# Patient Record
Sex: Female | Born: 1950 | Race: White | Hispanic: No | Smoking: Former smoker
Health system: Southern US, Community
[De-identification: ages and names within clinical notes are randomized; demographics above are authoritative.]

## PROBLEM LIST (undated history)

## (undated) DIAGNOSIS — E785 Hyperlipidemia, unspecified: Secondary | ICD-10-CM

## (undated) DIAGNOSIS — R519 Headache, unspecified: Secondary | ICD-10-CM

## (undated) DIAGNOSIS — I10 Essential (primary) hypertension: Secondary | ICD-10-CM

## (undated) DIAGNOSIS — R51 Headache: Secondary | ICD-10-CM

## (undated) DIAGNOSIS — M1990 Unspecified osteoarthritis, unspecified site: Secondary | ICD-10-CM

## (undated) HISTORY — PX: EYE SURGERY: SHX253

## (undated) HISTORY — PX: CATARACT EXTRACTION: SUR2

## (undated) HISTORY — DX: Hyperlipidemia, unspecified: E78.5

## (undated) HISTORY — PX: APPENDECTOMY: SHX54

---
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
## 1977-06-20 HISTORY — PX: TUBAL LIGATION: SHX77

## 1978-06-20 HISTORY — PX: BREAST BIOPSY: SHX20

## 2013-07-22 ENCOUNTER — Other Ambulatory Visit: Payer: Self-pay | Admitting: Internal Medicine

## 2013-07-22 ENCOUNTER — Ambulatory Visit
Admission: RE | Admit: 2013-07-22 | Discharge: 2013-07-22 | Disposition: A | Discharge status: Home / Self Care | Payer: Commercial Managed Care - HMO | Source: Ambulatory Visit | Attending: Internal Medicine | Admitting: Internal Medicine

## 2013-07-22 DIAGNOSIS — M25579 Pain in unspecified ankle and joints of unspecified foot: Secondary | ICD-10-CM

## 2013-09-30 ENCOUNTER — Other Ambulatory Visit: Payer: Self-pay | Admitting: Internal Medicine

## 2013-09-30 ENCOUNTER — Other Ambulatory Visit (HOSPITAL_COMMUNITY)
Admission: RE | Admit: 2013-09-30 | Discharge: 2013-09-30 | Disposition: A | Discharge status: Home / Self Care | Payer: Medicare HMO | Source: Ambulatory Visit | Attending: Internal Medicine | Admitting: Internal Medicine

## 2013-09-30 DIAGNOSIS — Z1151 Encounter for screening for human papillomavirus (HPV): Secondary | ICD-10-CM | POA: Insufficient documentation

## 2013-09-30 DIAGNOSIS — Z124 Encounter for screening for malignant neoplasm of cervix: Secondary | ICD-10-CM | POA: Insufficient documentation

## 2013-11-18 ENCOUNTER — Other Ambulatory Visit: Payer: Self-pay | Admitting: Gastroenterology

## 2014-01-23 ENCOUNTER — Encounter (HOSPITAL_COMMUNITY): Payer: Self-pay | Admitting: Pharmacy Technician

## 2014-01-27 ENCOUNTER — Encounter (HOSPITAL_COMMUNITY): Payer: Self-pay | Admitting: *Deleted

## 2014-02-10 NOTE — Anesthesia Preprocedure Evaluation (Addendum)
Anesthesia Evaluation  Patient identified by MRN, date of birth, ID band Patient awake    Reviewed: Allergy & Precautions, H&P , NPO status , Patient's Chart, lab work & pertinent test results  Airway Mallampati: II TM Distance: >3 FB Neck ROM: full    Dental no notable dental hx. (+) Teeth Intact, Dental Advisory Given   Pulmonary neg pulmonary ROS, Current Smoker,  breath sounds clear to auscultation  Pulmonary exam normal       Cardiovascular Exercise Tolerance: Good hypertension, Pt. on medications Rhythm:regular Rate:Normal     Neuro/Psych  Headaches, negative neurological ROS  negative psych ROS   GI/Hepatic negative GI ROS, Neg liver ROS, GERD-  Medicated and Controlled,  Endo/Other  negative endocrine ROS  Renal/GU negative Renal ROS  negative genitourinary   Musculoskeletal   Abdominal   Peds  Hematology negative hematology ROS (+)   Anesthesia Other Findings   Reproductive/Obstetrics negative OB ROS                          Anesthesia Physical Anesthesia Plan  ASA: II  Anesthesia Plan: MAC   Post-op Pain Management:    Induction:   Airway Management Planned:   Additional Equipment:   Intra-op Plan:   Post-operative Plan:   Informed Consent: I have reviewed the patients History and Physical, chart, labs and discussed the procedure including the risks, benefits and alternatives for the proposed anesthesia with the patient or authorized representative who has indicated his/her understanding and acceptance.   Dental Advisory Given  Plan Discussed with: CRNA and Surgeon  Anesthesia Plan Comments:         Anesthesia Quick Evaluation

## 2014-02-11 ENCOUNTER — Ambulatory Visit (HOSPITAL_COMMUNITY)
Admission: RE | Admit: 2014-02-11 | Discharge: 2014-02-11 | Disposition: A | Discharge status: Home / Self Care | Payer: Medicare HMO | Source: Ambulatory Visit | Attending: Gastroenterology | Admitting: Gastroenterology

## 2014-02-11 ENCOUNTER — Encounter (HOSPITAL_COMMUNITY): Payer: Self-pay | Admitting: *Deleted

## 2014-02-11 ENCOUNTER — Encounter (HOSPITAL_COMMUNITY): Payer: Medicare HMO | Admitting: Anesthesiology

## 2014-02-11 ENCOUNTER — Ambulatory Visit (HOSPITAL_COMMUNITY): Payer: Medicare HMO | Admitting: Anesthesiology

## 2014-02-11 ENCOUNTER — Encounter (HOSPITAL_COMMUNITY)
Admission: RE | Disposition: A | Discharge status: Home / Self Care | Payer: Self-pay | Source: Ambulatory Visit | Attending: Gastroenterology

## 2014-02-11 DIAGNOSIS — Z880 Allergy status to penicillin: Secondary | ICD-10-CM | POA: Insufficient documentation

## 2014-02-11 DIAGNOSIS — I10 Essential (primary) hypertension: Secondary | ICD-10-CM | POA: Insufficient documentation

## 2014-02-11 DIAGNOSIS — Z1211 Encounter for screening for malignant neoplasm of colon: Secondary | ICD-10-CM | POA: Diagnosis present

## 2014-02-11 DIAGNOSIS — K5730 Diverticulosis of large intestine without perforation or abscess without bleeding: Secondary | ICD-10-CM | POA: Insufficient documentation

## 2014-02-11 DIAGNOSIS — F17200 Nicotine dependence, unspecified, uncomplicated: Secondary | ICD-10-CM | POA: Insufficient documentation

## 2014-02-11 HISTORY — DX: Unspecified osteoarthritis, unspecified site: M19.90

## 2014-02-11 HISTORY — DX: Headache: R51

## 2014-02-11 HISTORY — DX: Headache, unspecified: R51.9

## 2014-02-11 HISTORY — DX: Essential (primary) hypertension: I10

## 2014-02-11 HISTORY — PX: COLONOSCOPY WITH PROPOFOL: SHX5780

## 2014-02-11 SURGERY — COLONOSCOPY WITH PROPOFOL
Anesthesia: Monitor Anesthesia Care

## 2014-02-11 MED ORDER — PROPOFOL 10 MG/ML IV BOLUS
INTRAVENOUS | Status: AC
  Filled 2014-02-11: qty 20

## 2014-02-11 MED ORDER — KETAMINE HCL 10 MG/ML IJ SOLN
INTRAMUSCULAR | Status: AC
  Filled 2014-02-11: qty 1

## 2014-02-11 MED ORDER — FENTANYL CITRATE 0.05 MG/ML IJ SOLN
25.0000 ug | INTRAMUSCULAR | Status: DC | PRN
Start: 2014-02-11 — End: 2014-02-11

## 2014-02-11 MED ORDER — LIDOCAINE HCL (CARDIAC) 20 MG/ML IV SOLN
INTRAVENOUS | Status: AC
  Filled 2014-02-11: qty 5

## 2014-02-11 MED ORDER — ONDANSETRON HCL 4 MG/2ML IJ SOLN
INTRAMUSCULAR | Status: AC
  Filled 2014-02-11: qty 2

## 2014-02-11 MED ORDER — SODIUM CHLORIDE 0.9 % IV SOLN: INTRAVENOUS | Status: DC

## 2014-02-11 MED ORDER — KETAMINE HCL 10 MG/ML IJ SOLN
INTRAMUSCULAR | Status: DC | PRN
  Administered 2014-02-11: 20 mg via INTRAVENOUS

## 2014-02-11 MED ORDER — MIDAZOLAM HCL 2 MG/2ML IJ SOLN
INTRAMUSCULAR | Status: DC | PRN
  Administered 2014-02-11 (×2): 1 mg via INTRAVENOUS

## 2014-02-11 MED ORDER — MIDAZOLAM HCL 2 MG/2ML IJ SOLN
INTRAMUSCULAR | Status: AC
  Filled 2014-02-11: qty 2

## 2014-02-11 MED ORDER — PROPOFOL INFUSION 10 MG/ML OPTIME
INTRAVENOUS | Status: DC | PRN
Start: 2014-02-11 — End: 2014-02-11
  Administered 2014-02-11: 120 ug/kg/min via INTRAVENOUS

## 2014-02-11 MED ORDER — LIDOCAINE HCL (CARDIAC) 20 MG/ML IV SOLN
INTRAVENOUS | Status: DC | PRN
  Administered 2014-02-11: 100 mg via INTRAVENOUS

## 2014-02-11 MED ORDER — LACTATED RINGERS IV SOLN
INTRAVENOUS | Status: DC
  Administered 2014-02-11: 07:00:00 via INTRAVENOUS

## 2014-02-11 MED ORDER — ONDANSETRON HCL 4 MG/2ML IJ SOLN
INTRAMUSCULAR | Status: DC | PRN
  Administered 2014-02-11: 4 mg via INTRAVENOUS

## 2014-02-11 SURGICAL SUPPLY — 22 items

## 2014-02-11 NOTE — Op Note (Signed)
Procedure: Baseline screening colonoscopy  Endoscopist: Danise Edge  Premedication: Propofol administered by anesthesia  Procedure: The patient was placed in the left lateral decubitus position. Anal inspection and digital rectal exam were normal. The Pentax pediatric colonoscope was introduced into the rectum and advanced to the cecum. A normal-appearing appendiceal orifice was identified. A normal-appearing ileocecal valve was identified. Colonic preparation for the exam today was good. The procedure was technically difficult to perform due to colonic loop formation.  Rectum. Normal. Retroflexed view of the distal rectum normal  Sigmoid colon and descending colon. Left colonic diverticulosis  Splenic flexure. Normal  Transverse colon. Normal  Hepatic flexure. Normal  Ascending colon. Normal  Cecum and ileocecal valve. Normal  Assessment: Normal screening colonoscopy  Recommendation: Schedule repeat screening colonoscopy in 10 years

## 2014-02-11 NOTE — Anesthesia Postprocedure Evaluation (Signed)
  Anesthesia Post-op Note  Patient: Jaclyn Golden  Procedure(s) Performed: Procedure(s) (LRB): COLONOSCOPY WITH PROPOFOL (N/A)  Patient Location: PACU  Anesthesia Type: MAC  Level of Consciousness: awake and alert   Airway and Oxygen Therapy: Patient Spontanous Breathing  Post-op Pain: mild  Post-op Assessment: Post-op Vital signs reviewed, Patient's Cardiovascular Status Stable, Respiratory Function Stable, Patent Airway and No signs of Nausea or vomiting  Last Vitals:  Filed Vitals:   02/11/14 0845  BP: 122/101  Pulse: 58  Temp:   Resp: 23    Post-op Vital Signs: stable   Complications: No apparent anesthesia complications

## 2014-02-11 NOTE — H&P (Signed)
  Procedure: Baseline screening colonoscopy  History: The patient is a 63 year old female born December 08, 1950. She is scheduled to undergo her first screening colonoscopy with polypectomy to prevent colon cancer.  Medication allergies: Penicillin  Past medical history: Appendectomy. Benign left breast lumpectomy. Osteopenia.  Family history: Negative for colon cancer  Habits: The patient smokes one half pack of cigarettes daily  Exam: The patient is alert and lying comfortably on the endoscopy stretcher. Abdomen is soft and nontender to palpation. Lungs are clear to auscultation. Cardiac exam reveals a regular rhythm.  Plan: Proceed with baseline screening colonoscopy

## 2014-02-11 NOTE — Discharge Instructions (Signed)
Colonoscopy, Care After °These instructions give you information on caring for yourself after your procedure. Your doctor may also give you more specific instructions. Call your doctor if you have any problems or questions after your procedure. °HOME CARE °· Do not drive for 24 hours. °· Do not sign important papers or use machinery for 24 hours. °· You may shower. °· You may go back to your usual activities, but go slower for the first 24 hours. °· Take rest breaks often during the first 24 hours. °· Walk around or use warm packs on your belly (abdomen) if you have belly cramping or gas. °· Drink enough fluids to keep your pee (urine) clear or pale yellow. °· Resume your normal diet. Avoid heavy or fried foods. °· Avoid drinking alcohol for 24 hours or as told by your doctor. °· Only take medicines as told by your doctor. °If a tissue sample (biopsy) was taken during the procedure:  °· Do not take aspirin or blood thinners for 7 days, or as told by your doctor. °· Do not drink alcohol for 7 days, or as told by your doctor. °· Eat soft foods for the first 24 hours. °GET HELP IF: °You still have a small amount of blood in your poop (stool) 2-3 days after the procedure. °GET HELP RIGHT AWAY IF: °· You have more than a small amount of blood in your poop. °· You see clumps of tissue (blood clots) in your poop. °· Your belly is puffy (swollen). °· You feel sick to your stomach (nauseous) or throw up (vomit). °· You have a fever. °· You have belly pain that gets worse and medicine does not help. °MAKE SURE YOU: °· Understand these instructions. °· Will watch your condition. °· Will get help right away if you are not doing well or get worse. °Document Released: 07/09/2010 Document Revised: 06/11/2013 Document Reviewed: 02/11/2013 °[employer]® Patient Information ©2015 [employer]. This information is not intended to replace advice given to you by your health care provider. Make sure you discuss any questions you have with  your health care provider. ° °

## 2014-02-11 NOTE — Transfer of Care (Signed)
Immediate Anesthesia Transfer of Care Note  Patient: Jaclyn Golden  Procedure(s) Performed: Procedure(s) (LRB): COLONOSCOPY WITH PROPOFOL (N/A)  Patient Location: PACU  Anesthesia Type: MAC  Level of Consciousness: sedated, patient cooperative and responds to stimulation  Airway & Oxygen Therapy: Patient Spontanous Breathing and Patient connected to face mask oxgen  Post-op Assessment: Report given to PACU RN and Post -op Vital signs reviewed and stable  Post vital signs: Reviewed and stable  Complications: No apparent anesthesia complications

## 2014-02-12 ENCOUNTER — Encounter (HOSPITAL_COMMUNITY): Payer: Self-pay | Admitting: Gastroenterology

## 2014-02-28 ENCOUNTER — Encounter: Payer: Self-pay | Admitting: Neurology

## 2014-02-28 ENCOUNTER — Ambulatory Visit (INDEPENDENT_AMBULATORY_CARE_PROVIDER_SITE_OTHER): Payer: Commercial Managed Care - HMO | Admitting: Neurology

## 2014-02-28 VITALS — BP 140/88 | HR 102 | Temp 98.0°F | Resp 18 | Ht 62.0 in | Wt 157.4 lb

## 2014-02-28 DIAGNOSIS — Z8249 Family history of ischemic heart disease and other diseases of the circulatory system: Secondary | ICD-10-CM

## 2014-02-28 DIAGNOSIS — N643 Galactorrhea not associated with childbirth: Secondary | ICD-10-CM

## 2014-02-28 DIAGNOSIS — Z823 Family history of stroke: Secondary | ICD-10-CM

## 2014-02-28 DIAGNOSIS — R51 Headache: Secondary | ICD-10-CM

## 2014-02-28 NOTE — Progress Notes (Signed)
NEUROLOGY CONSULTATION NOTE  MIRIYA CLOER MRN: [id number] DOB: 1950-11-20  Referring provider: Dr. Lonzo Cloud Primary care provider: Dr. Lonzo Cloud  Reason for consult:  Headache  HISTORY OF PRESENT ILLNESS: Jaclyn Golden is a 63 year old right-handed woman with history of hypertension who presents for headache.  Records reviewed.  She recently established care with a PCP.  She wasn't seen by a physician for several years.  She reported that she began lactating for the past year.  TSH and prolactin levels were unremarkable.  Mammogram was okay.  Beginning about two years ago, she began experiencing a headache.  She describes it as a small circumscribed area in the right occipital region, like a mild shooting type pain, lasting only a few seconds to minutes.  It would occur about 4 or 5 times a week.  When she lays down in bed, she feels a mild bruised feeling just above that area.  Many years ago, she had headaches, which seemed like migraines.  She would have a visual aura of seeing lightening bolts, followed by headache and sometimes nausea.  She hasn't had this for many years.  Recently, she began having the visual symptoms again.  Regarding family history, she had two brothers who died of a cerebral aneurysm and her mom had stroke.  09/30/13 LABS:  WBC 6.5, HGB 14.8, HCT 43.9, PLT 164, Na 139, K 4.5, Cl 103, glucose 87, BUN 14, Cr 0.75 01/10/14 LABS:  TSH 2.19, prolactin 6.15  PAST MEDICAL HISTORY: Past Medical History  Diagnosis Date  . Hypertension   . Pain in head     occasional pain back of head on right for last couple of years, to see neurlogist for   . Arthritis     right ankle, wears brace prn    PAST SURGICAL HISTORY: Past Surgical History  Procedure Laterality Date  . Breast biopsy Left 1980  . Tubal ligation  1979  . Appendectomy  1980's    ruptured  . Colonoscopy with propofol N/A 02/11/2014    Procedure: COLONOSCOPY WITH PROPOFOL;  Surgeon: Charolett Bumpers,  MD;  Location: [facility];  Service: Endoscopy;  Laterality: N/A;    MEDICATIONS: Current Outpatient Prescriptions on File Prior to Visit  Medication Sig Dispense Refill  . aspirin EC 81 MG tablet Take 81 mg by mouth daily.      . cholecalciferol (VITAMIN D) 1000 UNITS tablet Take 1,000 Units by mouth daily.      . diclofenac (VOLTAREN) 75 MG EC tablet Take 75 mg by mouth 2 (two) times daily as needed (Pain).      . Garlic (GARLIQUE PO) Take 1 tablet by mouth daily.      . hydrochlorothiazide (MICROZIDE) 12.5 MG capsule Take 12.5 mg by mouth every morning.      . Omega-3 Fatty Acids (FISH OIL) 1000 MG CAPS Take 1,000 mg by mouth daily.      . folic acid (FOLVITE) 800 MCG tablet Take 800 mcg by mouth daily.      Marland Kitchen omeprazole (PRILOSEC) 20 MG capsule Take 20 mg by mouth daily as needed (Acid Reflux).       No current facility-administered medications on file prior to visit.    ALLERGIES: Allergies  Allergen Reactions  . Penicillins     Pass out     FAMILY HISTORY: Family History  Problem Relation Age of Onset  . Stroke Mother   . Anuerysm Brother   . Heart failure Brother   . Cancer  Father     SOCIAL HISTORY: History   Social History  . Marital Status: Married    Spouse Name: N/A    Number of Children: N/A  . Years of Education: N/A   Occupational History  . Not on file.   Social History Main Topics  . Smoking status: Current Every Day Smoker -- 0.50 packs/day for 47 years    Types: Cigarettes  . Smokeless tobacco: Never Used     Comment: patient is aware she needs to quit   . Alcohol Use: No  . Drug Use: No  . Sexual Activity: No   Other Topics Concern  . Not on file   Social History Narrative  . No narrative on file    REVIEW OF SYSTEMS: Constitutional: No fevers, chills, or sweats, no generalized fatigue, change in appetite Eyes: No visual changes, double vision, eye pain Ear, nose and throat: No hearing loss, ear pain, nasal congestion, sore  throat Cardiovascular: No chest pain, palpitations Respiratory:  No shortness of breath at rest or with exertion, wheezes GastrointestinaI: No nausea, vomiting, diarrhea, abdominal pain, fecal incontinence Genitourinary:  No dysuria, urinary retention or frequency Musculoskeletal:  No neck pain, back pain Integumentary: No rash, pruritus, skin lesions Neurological: as above Psychiatric: No depression, insomnia, anxiety Endocrine: Galactorrhea.  No palpitations, fatigue, diaphoresis, mood swings, change in appetite, change in weight, increased thirst Hematologic/Lymphatic:  No anemia, purpura, petechiae. Allergic/Immunologic: no itchy/runny eyes, nasal congestion, recent allergic reactions, rashes  PHYSICAL EXAM: Filed Vitals:   02/28/14 0931  BP: 140/88  Pulse: 102  Temp: 98 F (36.7 C)  Resp: 18   General: No acute distress Head:  Normocephalic/atraumatic Neck: supple, no paraspinal tenderness, full range of motion Back: No paraspinal tenderness Heart: regular rate and rhythm Lungs: Clear to auscultation bilaterally. Vascular: No carotid bruits. Neurological Exam: Mental status: alert and oriented to person, place, and time, recent and remote memory intact, fund of knowledge intact, attention and concentration intact, speech fluent and not dysarthric, language intact. Cranial nerves: CN I: not tested CN II: pupils equal, round and reactive to light, visual fields intact, fundi unremarkable, without vessel changes, exudates, hemorrhages or papilledema. CN III, IV, VI:  full range of motion, no nystagmus, no ptosis CN V: facial sensation intact CN VII: upper and lower face symmetric CN VIII: hearing intact CN IX, X: gag intact, uvula midline CN XI: sternocleidomastoid and trapezius muscles intact CN XII: tongue midline Bulk & Tone: normal, no fasciculations. Motor: 5/5 throughout Sensation: pinprick and vibration intact Deep Tendon Reflexes: 2+ throughout, toes withdraw  bilaterally Finger to nose testing: no dysmetria Heel to shin: no dysmetria Gait: normal station and stride.  Able to turn and walk in tandem. Romberg negative.  IMPRESSION: Headache, possibly nummular headache, which is diagnosis of exclusion Visual aura Galactorrhea Family history of cerebral aneurysm in 2 first degree relatives.  PLAN: 1.  Will get MRI of the brain with and without contrast and MRA of the head to evaluate for intracranial abnormality such as pituitary adenoma or aneurysm 2.  If MRI unremarkable, would suggest considering referral to endocrinology 3.  Regarding headache, suggested over the counter analgesics, although they are so brief in duration.  Otherwise, we can try gabapentin or nortriptyline.   4.  We will contact her with MRI results and advise further management pending these results.  If unremarkable, she may follow up as needed for headache treatment.  45 minutes spent with patient, over 50% spent discussing possible etiologies and  coordinating plan.  Thank you for allowing me to take part in the care of this patient.  Shon Millet, DO  CC:  Orland Penman, MD

## 2014-02-28 NOTE — Patient Instructions (Addendum)
1.  We will get MRI of brain with and without contrast and MRA of the head without contrast.  We will let you know what the results show and whether further workup is necessary.  If you would like medication for the headache, call.  You can try over the counter analgesics. [facility]  03/13/14 7:45 am

## 2014-03-13 ENCOUNTER — Telehealth: Payer: Self-pay | Admitting: *Deleted

## 2014-03-13 ENCOUNTER — Ambulatory Visit (HOSPITAL_COMMUNITY): Payer: Medicare HMO

## 2014-03-13 ENCOUNTER — Other Ambulatory Visit (HOSPITAL_COMMUNITY): Payer: Medicare HMO

## 2014-03-13 ENCOUNTER — Ambulatory Visit (HOSPITAL_COMMUNITY)
Admission: RE | Admit: 2014-03-13 | Discharge: 2014-03-13 | Disposition: A | Discharge status: Home / Self Care | Payer: Medicare HMO | Source: Ambulatory Visit | Attending: Neurology | Admitting: Neurology

## 2014-03-13 DIAGNOSIS — Z8249 Family history of ischemic heart disease and other diseases of the circulatory system: Secondary | ICD-10-CM

## 2014-03-13 DIAGNOSIS — N643 Galactorrhea not associated with childbirth: Secondary | ICD-10-CM | POA: Diagnosis not present

## 2014-03-13 DIAGNOSIS — R51 Headache: Secondary | ICD-10-CM | POA: Insufficient documentation

## 2014-03-13 DIAGNOSIS — Z823 Family history of stroke: Secondary | ICD-10-CM | POA: Insufficient documentation

## 2014-03-13 LAB — POCT I-STAT CREATININE: CREATININE: 0.9 mg/dL (ref 0.50–1.10)

## 2014-03-13 MED ORDER — GADOBENATE DIMEGLUMINE 529 MG/ML IV SOLN
11.0000 mL | Freq: Once | INTRAVENOUS | Status: AC | PRN
  Administered 2014-03-13: 11 mL via INTRAVENOUS

## 2014-03-13 NOTE — Telephone Encounter (Signed)
Please give patient a call  Call back number [phone]

## 2014-03-13 NOTE — Telephone Encounter (Signed)
patient is aware of MRI results . 

## 2014-03-13 NOTE — Telephone Encounter (Signed)
Message copied by Fredirick Maudlin on Thu Mar 13, 2014  1:08 PM ------      Message from: JAFFE, ADAM R      Created: Thu Mar 13, 2014  9:02 AM       MRI shows no evidence for stroke, tumor, aneurysm or abnormality of the pituitary gland.  It does show an incidental cyst, which is completely benign and not clinically significant.  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  She is already on aspirin, which is appropriate.  I would have her PCP check her cholesterol, if it has not already been done.  At this point, I would just monitor.  Follow up as needed.      ----- Message -----         From: Rad Results In Interface         Sent: 03/13/2014   8:47 AM           To: Cira Servant, DO                   ------

## 2014-04-15 ENCOUNTER — Encounter (HOSPITAL_COMMUNITY): Payer: Self-pay | Admitting: Emergency Medicine

## 2014-04-15 ENCOUNTER — Emergency Department (HOSPITAL_COMMUNITY)
Admission: EM | Admit: 2014-04-15 | Discharge: 2014-04-15 | Disposition: A | Discharge status: Home / Self Care | Payer: Commercial Managed Care - HMO | Attending: Emergency Medicine | Admitting: Emergency Medicine

## 2014-04-15 DIAGNOSIS — I10 Essential (primary) hypertension: Secondary | ICD-10-CM | POA: Diagnosis not present

## 2014-04-15 DIAGNOSIS — W231XXA Caught, crushed, jammed, or pinched between stationary objects, initial encounter: Secondary | ICD-10-CM | POA: Insufficient documentation

## 2014-04-15 DIAGNOSIS — S61412A Laceration without foreign body of left hand, initial encounter: Secondary | ICD-10-CM

## 2014-04-15 DIAGNOSIS — Z791 Long term (current) use of non-steroidal anti-inflammatories (NSAID): Secondary | ICD-10-CM | POA: Diagnosis not present

## 2014-04-15 DIAGNOSIS — Z7982 Long term (current) use of aspirin: Secondary | ICD-10-CM | POA: Diagnosis not present

## 2014-04-15 DIAGNOSIS — Z79899 Other long term (current) drug therapy: Secondary | ICD-10-CM | POA: Diagnosis not present

## 2014-04-15 DIAGNOSIS — Y9289 Other specified places as the place of occurrence of the external cause: Secondary | ICD-10-CM | POA: Insufficient documentation

## 2014-04-15 DIAGNOSIS — Z880 Allergy status to penicillin: Secondary | ICD-10-CM | POA: Diagnosis not present

## 2014-04-15 DIAGNOSIS — Y93K1 Activity, walking an animal: Secondary | ICD-10-CM | POA: Insufficient documentation

## 2014-04-15 DIAGNOSIS — M1990 Unspecified osteoarthritis, unspecified site: Secondary | ICD-10-CM | POA: Diagnosis not present

## 2014-04-15 DIAGNOSIS — Z720 Tobacco use: Secondary | ICD-10-CM | POA: Diagnosis not present

## 2014-04-15 MED ORDER — DOXYCYCLINE HYCLATE 100 MG PO CAPS: 100.0000 mg | ORAL_CAPSULE | Freq: Two times a day (BID) | ORAL | Status: DC

## 2014-04-15 MED ORDER — DOXYCYCLINE HYCLATE 100 MG PO TABS
100.0000 mg | ORAL_TABLET | Freq: Once | ORAL | Status: AC
  Administered 2014-04-15: 100 mg via ORAL
  Filled 2014-04-15: qty 1

## 2014-04-15 NOTE — Discharge Instructions (Signed)
Part of your wound was closed with steri strip and part sutured through the steri strips the sutures should be removed in 10 days  Leave the dressing on for 48 hours than remove and apply a smaller bandage  Do no apply topical antibiotic ointment as it will loosen the steri strips

## 2014-04-15 NOTE — ED Provider Notes (Signed)
Medical screening examination/treatment/procedure(s) were performed by non-physician practitioner and as supervising physician I was immediately available for consultation/collaboration.   EKG Interpretation None       Vannie Hilgert, MD 04/15/14 2240 

## 2014-04-15 NOTE — ED Provider Notes (Addendum)
CSN: [id number]     Arrival date & time 04/15/14  1848 History   First MD Initiated Contact with Patient 04/15/14 2043     Chief Complaint  Patient presents with  . Extremity Laceration     (Consider location/radiation/quality/duration/timing/severity/associated sxs/prior Treatment) HPI Comments: Laceration to left hand after door closed on it has been applying pressure but continues to bleed   The history is provided by the patient.    Past Medical History  Diagnosis Date  . Hypertension   . Pain in head     occasional pain back of head on right for last couple of years, to see neurlogist for   . Arthritis     right ankle, wears brace prn   Past Surgical History  Procedure Laterality Date  . Breast biopsy Left 1980  . Tubal ligation  1979  . Appendectomy  1980's    ruptured  . Colonoscopy with propofol N/A 02/11/2014    Procedure: COLONOSCOPY WITH PROPOFOL;  Surgeon: Charolett BumpersMartin K Johnson, MD;  Location: [facility];  Service: Endoscopy;  Laterality: N/A;   Family History  Problem Relation Age of Onset  . Stroke Mother   . Anuerysm Brother   . Heart failure Brother   . Cancer Father    History  Substance Use Topics  . Smoking status: Current Every Day Smoker -- 0.50 packs/day for 47 years    Types: Cigarettes  . Smokeless tobacco: Never Used     Comment: patient is aware she needs to quit   . Alcohol Use: No   OB History   Grav Para Term Preterm Abortions TAB SAB Ect Mult Living                 Review of Systems  Musculoskeletal: Negative for joint swelling.  Skin: Positive for wound.  Neurological: Negative for weakness and numbness.  All other systems reviewed and are negative.     Allergies  Penicillins  Home Medications   Prior to Admission medications   Medication Sig Start Date End Date Taking? Authorizing Provider  aspirin EC 81 MG tablet Take 81 mg by mouth daily.    Historical Provider, MD  cholecalciferol (VITAMIN D) 1000 UNITS tablet Take  1,000 Units by mouth daily.    Historical Provider, MD  diclofenac (VOLTAREN) 75 MG EC tablet Take 75 mg by mouth 2 (two) times daily as needed (Pain).    Historical Provider, MD  doxycycline (VIBRAMYCIN) 100 MG capsule Take 1 capsule (100 mg total) by mouth 2 (two) times daily. 04/15/14   Arman FilterGail K Bettie Capistran, NP  folic acid (FOLVITE) 800 MCG tablet Take 800 mcg by mouth daily.    Historical Provider, MD  Garlic (GARLIQUE PO) Take 1 tablet by mouth daily.    Historical Provider, MD  hydrochlorothiazide (MICROZIDE) 12.5 MG capsule Take 12.5 mg by mouth every morning.    Historical Provider, MD  Omega-3 Fatty Acids (FISH OIL) 1000 MG CAPS Take 1,000 mg by mouth daily.    Historical Provider, MD  omeprazole (PRILOSEC) 20 MG capsule Take 20 mg by mouth daily as needed (Acid Reflux).    Historical Provider, MD   BP 151/108  Pulse 73  Temp(Src) 97.7 F (36.5 C) (Oral)  Resp 18  SpO2 95% Physical Exam  Constitutional: She appears well-developed and well-nourished.  Eyes: Pupils are equal, round, and reactive to light.  Neck: Normal range of motion.  Cardiovascular: Normal rate.   Musculoskeletal: Normal range of motion. She exhibits tenderness. She exhibits  no edema.       Hands: Skin: Skin is warm.  Nursing note and vitals reviewed.   ED Course  LACERATION REPAIR Date/Time: 04/15/2014 9:39 PM Performed by: Arman FilterSCHULZ, Stevie Charter K Authorized by: Arman FilterSCHULZ, Makenli Derstine K Consent: Verbal consent obtained. Risks and benefits: risks, benefits and alternatives were discussed Consent given by: patient Patient understanding: patient states understanding of the procedure being performed Patient identity confirmed: verbally with patient Body area: upper extremity Location details: left hand Laceration length: 6 cm Foreign bodies: no foreign bodies Tendon involvement: none Nerve involvement: none Vascular damage: no Anesthesia: local infiltration Local anesthetic: lidocaine 2% without epinephrine Anesthetic  total: 1 ml Patient sedated: no Irrigation solution: saline Skin closure: 4-0 Prolene and Steri-Strips Number of sutures: 2 Technique: simple Approximation: loose Approximation difficulty: simple Dressing: pressure dressing Patient tolerance: Patient tolerated the procedure well with no immediate complications   (including critical care time) Labs Review Labs Reviewed - No data to display  Imaging Review No results found.   EKG Interpretation None     Part of the laceration closed with steri strips and part sutured  Sutures to be removed in 10 day  MDM   Final diagnoses:  Laceration of hand, left, initial encounter         Arman FilterGail K Auden Tatar, NP 04/15/14 2141  Arman FilterGail K Shabazz Mckey, NP 04/25/14 [id number]  Arby BarretteMarcy Pfeiffer, MD 04/28/14 1705

## 2014-04-15 NOTE — ED Notes (Signed)
Pt walking dog today.  Caught hand on storm door.  Laceration to left hand.  Occurred at 2pm and wound continues to bleed.  Pressure dressing applied.

## 2014-12-15 ENCOUNTER — Other Ambulatory Visit: Payer: Self-pay

## 2015-03-04 DIAGNOSIS — H5319 Other subjective visual disturbances: Secondary | ICD-10-CM | POA: Diagnosis not present

## 2015-03-04 DIAGNOSIS — H2512 Age-related nuclear cataract, left eye: Secondary | ICD-10-CM | POA: Diagnosis not present

## 2015-03-04 DIAGNOSIS — H35362 Drusen (degenerative) of macula, left eye: Secondary | ICD-10-CM | POA: Diagnosis not present

## 2015-03-04 DIAGNOSIS — H25012 Cortical age-related cataract, left eye: Secondary | ICD-10-CM | POA: Diagnosis not present

## 2015-03-18 DIAGNOSIS — H43811 Vitreous degeneration, right eye: Secondary | ICD-10-CM | POA: Diagnosis not present

## 2015-03-23 DIAGNOSIS — I10 Essential (primary) hypertension: Secondary | ICD-10-CM | POA: Diagnosis not present

## 2015-03-23 DIAGNOSIS — M79604 Pain in right leg: Secondary | ICD-10-CM | POA: Diagnosis not present

## 2015-03-23 DIAGNOSIS — E785 Hyperlipidemia, unspecified: Secondary | ICD-10-CM | POA: Diagnosis not present

## 2015-03-23 DIAGNOSIS — Z23 Encounter for immunization: Secondary | ICD-10-CM | POA: Diagnosis not present

## 2015-03-23 DIAGNOSIS — F17210 Nicotine dependence, cigarettes, uncomplicated: Secondary | ICD-10-CM | POA: Diagnosis not present

## 2015-03-23 DIAGNOSIS — Z1389 Encounter for screening for other disorder: Secondary | ICD-10-CM | POA: Diagnosis not present

## 2015-03-23 DIAGNOSIS — Z0001 Encounter for general adult medical examination with abnormal findings: Secondary | ICD-10-CM | POA: Diagnosis not present

## 2015-03-25 DIAGNOSIS — M47816 Spondylosis without myelopathy or radiculopathy, lumbar region: Secondary | ICD-10-CM | POA: Diagnosis not present

## 2015-03-25 DIAGNOSIS — M5136 Other intervertebral disc degeneration, lumbar region: Secondary | ICD-10-CM | POA: Diagnosis not present

## 2015-03-25 DIAGNOSIS — M25561 Pain in right knee: Secondary | ICD-10-CM | POA: Diagnosis not present

## 2015-03-25 DIAGNOSIS — M25551 Pain in right hip: Secondary | ICD-10-CM | POA: Diagnosis not present

## 2015-04-03 DIAGNOSIS — Z1231 Encounter for screening mammogram for malignant neoplasm of breast: Secondary | ICD-10-CM | POA: Diagnosis not present

## 2015-04-08 DIAGNOSIS — M5136 Other intervertebral disc degeneration, lumbar region: Secondary | ICD-10-CM | POA: Diagnosis not present

## 2015-04-08 DIAGNOSIS — M47816 Spondylosis without myelopathy or radiculopathy, lumbar region: Secondary | ICD-10-CM | POA: Diagnosis not present

## 2015-04-13 DIAGNOSIS — H5319 Other subjective visual disturbances: Secondary | ICD-10-CM | POA: Diagnosis not present

## 2015-04-13 DIAGNOSIS — H35341 Macular cyst, hole, or pseudohole, right eye: Secondary | ICD-10-CM | POA: Diagnosis not present

## 2015-04-21 ENCOUNTER — Encounter (INDEPENDENT_AMBULATORY_CARE_PROVIDER_SITE_OTHER): Payer: Self-pay | Admitting: Ophthalmology

## 2015-04-22 ENCOUNTER — Encounter (INDEPENDENT_AMBULATORY_CARE_PROVIDER_SITE_OTHER): Payer: Self-pay | Admitting: Ophthalmology

## 2015-04-27 ENCOUNTER — Encounter (INDEPENDENT_AMBULATORY_CARE_PROVIDER_SITE_OTHER): Payer: Self-pay | Admitting: Ophthalmology

## 2015-04-30 ENCOUNTER — Encounter (INDEPENDENT_AMBULATORY_CARE_PROVIDER_SITE_OTHER): Payer: Self-pay | Admitting: Ophthalmology

## 2015-05-06 ENCOUNTER — Encounter (INDEPENDENT_AMBULATORY_CARE_PROVIDER_SITE_OTHER): Payer: Commercial Managed Care - HMO | Admitting: Ophthalmology

## 2015-05-06 DIAGNOSIS — H2513 Age-related nuclear cataract, bilateral: Secondary | ICD-10-CM | POA: Diagnosis not present

## 2015-05-06 DIAGNOSIS — H35033 Hypertensive retinopathy, bilateral: Secondary | ICD-10-CM | POA: Diagnosis not present

## 2015-05-06 DIAGNOSIS — H43813 Vitreous degeneration, bilateral: Secondary | ICD-10-CM | POA: Diagnosis not present

## 2015-05-06 DIAGNOSIS — I10 Essential (primary) hypertension: Secondary | ICD-10-CM | POA: Diagnosis not present

## 2015-05-06 DIAGNOSIS — H35341 Macular cyst, hole, or pseudohole, right eye: Secondary | ICD-10-CM

## 2015-07-01 DIAGNOSIS — H2512 Age-related nuclear cataract, left eye: Secondary | ICD-10-CM | POA: Diagnosis not present

## 2015-07-01 DIAGNOSIS — H2511 Age-related nuclear cataract, right eye: Secondary | ICD-10-CM | POA: Diagnosis not present

## 2015-07-01 DIAGNOSIS — H35341 Macular cyst, hole, or pseudohole, right eye: Secondary | ICD-10-CM | POA: Diagnosis not present

## 2015-07-07 ENCOUNTER — Ambulatory Visit: Admit: 2015-07-07 | Payer: Medicare HMO | Admitting: Ophthalmology

## 2015-07-07 SURGERY — 25 GAUGE PARS PLANA VITRECTOMY WITH 20 GAUGE MVR PORT FOR MACULAR HOLE
Anesthesia: General | Laterality: Right

## 2015-07-14 ENCOUNTER — Ambulatory Visit (INDEPENDENT_AMBULATORY_CARE_PROVIDER_SITE_OTHER): Payer: Commercial Managed Care - HMO | Admitting: Ophthalmology

## 2015-07-21 DIAGNOSIS — H547 Unspecified visual loss: Secondary | ICD-10-CM | POA: Diagnosis not present

## 2015-07-21 DIAGNOSIS — H35341 Macular cyst, hole, or pseudohole, right eye: Secondary | ICD-10-CM | POA: Diagnosis not present

## 2015-07-28 ENCOUNTER — Encounter (HOSPITAL_COMMUNITY): Payer: Self-pay

## 2015-07-28 ENCOUNTER — Emergency Department (HOSPITAL_COMMUNITY): Payer: Commercial Managed Care - HMO

## 2015-07-28 ENCOUNTER — Emergency Department (HOSPITAL_COMMUNITY)
Admission: EM | Admit: 2015-07-28 | Discharge: 2015-07-28 | Disposition: A | Discharge status: Home / Self Care | Payer: Commercial Managed Care - HMO | Attending: Emergency Medicine | Admitting: Emergency Medicine

## 2015-07-28 DIAGNOSIS — Y92239 Unspecified place in hospital as the place of occurrence of the external cause: Secondary | ICD-10-CM | POA: Insufficient documentation

## 2015-07-28 DIAGNOSIS — I10 Essential (primary) hypertension: Secondary | ICD-10-CM | POA: Diagnosis not present

## 2015-07-28 DIAGNOSIS — W08XXXA Fall from other furniture, initial encounter: Secondary | ICD-10-CM | POA: Diagnosis not present

## 2015-07-28 DIAGNOSIS — Z79899 Other long term (current) drug therapy: Secondary | ICD-10-CM | POA: Insufficient documentation

## 2015-07-28 DIAGNOSIS — S0990XA Unspecified injury of head, initial encounter: Secondary | ICD-10-CM

## 2015-07-28 DIAGNOSIS — Y998 Other external cause status: Secondary | ICD-10-CM | POA: Insufficient documentation

## 2015-07-28 DIAGNOSIS — Z792 Long term (current) use of antibiotics: Secondary | ICD-10-CM | POA: Diagnosis not present

## 2015-07-28 DIAGNOSIS — M19071 Primary osteoarthritis, right ankle and foot: Secondary | ICD-10-CM | POA: Insufficient documentation

## 2015-07-28 DIAGNOSIS — Y9389 Activity, other specified: Secondary | ICD-10-CM | POA: Insufficient documentation

## 2015-07-28 DIAGNOSIS — Z7952 Long term (current) use of systemic steroids: Secondary | ICD-10-CM | POA: Insufficient documentation

## 2015-07-28 DIAGNOSIS — S098XXA Other specified injuries of head, initial encounter: Secondary | ICD-10-CM | POA: Diagnosis not present

## 2015-07-28 NOTE — Discharge Instructions (Signed)
Head Injury, Adult °You have a head injury. Headaches and throwing up (vomiting) are common after a head injury. It should be easy to wake up from sleeping. Sometimes you must stay in the hospital. Most problems happen within the first 24 hours. Side effects may occur up to 7-10 days after the injury.  °WHAT ARE THE TYPES OF HEAD INJURIES? °Head injuries can be as minor as a bump. Some head injuries can be more severe. More severe head injuries include: °· A jarring injury to the brain (concussion). °· A bruise of the brain (contusion). This mean there is bleeding in the brain that can cause swelling. °· A cracked skull (skull fracture). °· Bleeding in the brain that collects, clots, and forms a bump (hematoma). °WHEN SHOULD I GET HELP RIGHT AWAY?  °· You are confused or sleepy. °· You cannot be woken up. °· You feel sick to your stomach (nauseous) or keep throwing up (vomiting). °· Your dizziness or unsteadiness is getting worse. °· You have very bad, lasting headaches that are not helped by medicine. Take medicines only as told by your doctor. °· You cannot use your arms or legs like normal. °· You cannot walk. °· You notice changes in the black spots in the center of the colored part of your eye (pupil). °· You have clear or bloody fluid coming from your nose or ears. °· You have trouble seeing. °During the next 24 hours after the injury, you must stay with someone who can watch you. This person should get help right away (call 911 in the [place] if you start to shake and are not able to control it (have seizures), you pass out, or you are unable to wake up. °HOW CAN I PREVENT A HEAD INJURY IN THE FUTURE? °· Wear seat belts. °· Wear a helmet while bike riding and playing sports like football. °· Stay away from dangerous activities around the house. °WHEN CAN I RETURN TO NORMAL ACTIVITIES AND ATHLETICS? °See your doctor before doing these activities. You should not do normal activities or play contact sports until 1  week after the following symptoms have stopped: °· Headache that does not go away. °· Dizziness. °· Poor attention. °· Confusion. °· Memory problems. °· Sickness to your stomach or throwing up. °· Tiredness. °· Fussiness. °· Bothered by bright lights or loud noises. °· Anxiousness or depression. °· Restless sleep. °MAKE SURE YOU:  °· Understand these instructions. °· Will watch your condition. °· Will get help right away if you are not doing well or get worse. °  °This information is not intended to replace advice given to you by your health care provider. Make sure you discuss any questions you have with your health care provider. °  °Document Released: 05/19/2008 Document Revised: 06/27/2014 Document Reviewed: 02/11/2013 °Elsevier Interactive Patient Education ©2016 [employer] ° °

## 2015-07-28 NOTE — ED Provider Notes (Signed)
TIME SEEN: 5:00 AM  CHIEF COMPLAINT:  Head injury  HPI: Pt is a 65 y.o.  Female with history of hypertension who presents to the emergency department a head injury. States she is staying in the hospital with her husband who is admitted. Reports that she fell off of the couch in his room instruct the left side of her head. No loss of consciousness. She is not on anticoagulation. She denies numbness, tingling or focal weakness. Denies any other injury. Does have a subconjunctival hemorrhage to the right eye from a recent vitrectomy one week ago. No vision changes.  ROS: See HPI Constitutional: no fever  Eyes: no drainage  ENT: no runny nose   Cardiovascular:  no chest pain  Resp: no SOB  GI: no vomiting GU: no dysuria Integumentary: no rash  Allergy: no hives  Musculoskeletal: no leg swelling  Neurological: no slurred speech ROS otherwise negative  PAST MEDICAL HISTORY/PAST SURGICAL HISTORY:  Past Medical History  Diagnosis Date  . Hypertension   . Pain in head     occasional pain back of head on right for last couple of years, to see neurlogist for   . Arthritis     right ankle, wears brace prn    MEDICATIONS:  Prior to Admission medications   Medication Sig Start Date End Date Taking? Authorizing Provider  acetaminophen (TYLENOL) 500 MG tablet Take 1,000 mg by mouth every 6 (six) hours as needed (for pain.).   Yes Historical Provider, MD  hydrochlorothiazide (MICROZIDE) 12.5 MG capsule Take 12.5 mg by mouth.    Yes Historical Provider, MD  prednisoLONE acetate (PRED FORTE) 1 % ophthalmic suspension Place 1 drop into the right eye 2 (two) times daily. 07/06/15  Yes Historical Provider, MD  tobramycin (TOBREX) 0.3 % ophthalmic solution Place 1 drop into the right eye 2 (two) times daily. 07/06/15  Yes Historical Provider, MD  naproxen (NAPROSYN) 500 MG tablet Take 500 mg by mouth 2 (two) times daily as needed. For inflammation 05/20/15   Historical Provider, MD    ALLERGIES:   Allergies  Allergen Reactions  . Penicillins Other (See Comments)    Pass out   Has patient had a PCN reaction causing immediate rash, facial/tongue/throat swelling, SOB or lightheadedness with hypotension:YES Has patient had a PCN reaction causing severe rash involving mucus membranes or skin necrosis:No Has patient had a PCN reaction that required hospitalization:NO Has patient had a PCN reaction occurring within the last 10 years:NO If all of the above answers are "NO", then may proceed with Cephalosporin use.     SOCIAL HISTORY:  Social History  Substance Use Topics  . Smoking status: Current Every Day Smoker -- 0.50 packs/day for 47 years    Types: Cigarettes  . Smokeless tobacco: Never Used     Comment: patient is aware she needs to quit   . Alcohol Use: No    FAMILY HISTORY: Family History  Problem Relation Age of Onset  . Stroke Mother   . Anuerysm Brother   . Heart failure Brother   . Cancer Father     EXAM: BP 169/84 mmHg  Pulse 77  Temp(Src) 97.7 F (36.5 C) (Oral)  Resp 20  SpO2 95% CONSTITUTIONAL: Alert and oriented and responds appropriately to questions. Well-appearing; well-nourished; GCS 15 HEAD: Normocephalic; atraumatic , tender to the left side of the head without any swelling or skull depression EYES: Conjunctiva  Clear on the left side, extra Movements intact, pupils equal and reactive bilaterally , patient  does have a subconjunctival hemorrhage on the right side from recent vitrectomy, no hyphema , reports normal vision ENT: normal nose; no rhinorrhea; moist mucous membranes; pharynx without lesions noted; no dental injury; no septal hematoma NECK: Supple, no meningismus, no LAD; no midline spinal tenderness, step-off or deformity CARD: RRR; S1 and S2 appreciated; no murmurs, no clicks, no rubs, no gallops RESP: Normal chest excursion without splinting or tachypnea; breath sounds clear and equal bilaterally; no wheezes, no rhonchi, no rales; no  hypoxia or respiratory distress CHEST:  chest wall stable, no crepitus or ecchymosis or deformity, nontender to palpation ABD/GI: Normal bowel sounds; non-distended; soft, non-tender, no rebound, no guarding PELVIS:  stable, nontender to palpation BACK:  The back appears normal and is non-tender to palpation, there is no CVA tenderness; no midline spinal tenderness, step-off or deformity EXT: Normal ROM in all joints; non-tender to palpation; no edema; normal capillary refill; no cyanosis, no bony tenderness or bony deformity of patient's extremities, no joint effusion, no ecchymosis or lacerations    SKIN: Normal color for age and race; warm NEURO: Moves all extremities equally, sensation to light touch intact diffusely, cranial nerves II through XII intact PSYCH: The patient's mood and manner are appropriate. Grooming and personal hygiene are appropriate.  MEDICAL DECISION MAKING:  Patient here with head injury. CT of her head shows no acute abnormality. No sign of skull fracture on exam. Neurologically intact. Have offered her medication for pain which he declines. Have discussed with her at length head injury return precautions. No other sign of trauma on exam. I feel she is safe to be discharged. She verbalizes understanding and is comfortable with this plan.   Layla Maw Ward, DO 07/28/15 [phone]

## 2015-07-28 NOTE — ED Notes (Signed)
Pt has a gas bubble placed in her right eye

## 2015-07-28 NOTE — ED Notes (Signed)
Pt is a visitor from upstairs and fell off the sofa this am hitting her head on the floor

## 2015-07-28 NOTE — ED Notes (Signed)
Pt had eye surgery last Tuesday

## 2015-07-30 DIAGNOSIS — Z09 Encounter for follow-up examination after completed treatment for conditions other than malignant neoplasm: Secondary | ICD-10-CM | POA: Diagnosis not present

## 2015-07-30 DIAGNOSIS — H35341 Macular cyst, hole, or pseudohole, right eye: Secondary | ICD-10-CM | POA: Diagnosis not present

## 2015-08-27 DIAGNOSIS — H35341 Macular cyst, hole, or pseudohole, right eye: Secondary | ICD-10-CM | POA: Diagnosis not present

## 2015-08-27 DIAGNOSIS — Z09 Encounter for follow-up examination after completed treatment for conditions other than malignant neoplasm: Secondary | ICD-10-CM | POA: Diagnosis not present

## 2016-01-12 DIAGNOSIS — H43812 Vitreous degeneration, left eye: Secondary | ICD-10-CM | POA: Diagnosis not present

## 2016-01-12 DIAGNOSIS — H35341 Macular cyst, hole, or pseudohole, right eye: Secondary | ICD-10-CM | POA: Diagnosis not present

## 2016-01-12 DIAGNOSIS — H2512 Age-related nuclear cataract, left eye: Secondary | ICD-10-CM | POA: Diagnosis not present

## 2016-01-12 DIAGNOSIS — H2513 Age-related nuclear cataract, bilateral: Secondary | ICD-10-CM | POA: Diagnosis not present

## 2016-01-12 DIAGNOSIS — H2511 Age-related nuclear cataract, right eye: Secondary | ICD-10-CM | POA: Diagnosis not present

## 2016-01-18 DIAGNOSIS — H2511 Age-related nuclear cataract, right eye: Secondary | ICD-10-CM | POA: Diagnosis not present

## 2016-01-18 DIAGNOSIS — H5203 Hypermetropia, bilateral: Secondary | ICD-10-CM | POA: Diagnosis not present

## 2016-01-18 DIAGNOSIS — H25011 Cortical age-related cataract, right eye: Secondary | ICD-10-CM | POA: Diagnosis not present

## 2016-01-18 DIAGNOSIS — H2512 Age-related nuclear cataract, left eye: Secondary | ICD-10-CM | POA: Diagnosis not present

## 2016-01-18 DIAGNOSIS — H25012 Cortical age-related cataract, left eye: Secondary | ICD-10-CM | POA: Diagnosis not present

## 2016-01-18 DIAGNOSIS — H35363 Drusen (degenerative) of macula, bilateral: Secondary | ICD-10-CM | POA: Diagnosis not present

## 2016-02-24 DIAGNOSIS — H25011 Cortical age-related cataract, right eye: Secondary | ICD-10-CM | POA: Diagnosis not present

## 2016-02-24 DIAGNOSIS — H25811 Combined forms of age-related cataract, right eye: Secondary | ICD-10-CM | POA: Diagnosis not present

## 2016-02-24 DIAGNOSIS — H5703 Miosis: Secondary | ICD-10-CM | POA: Diagnosis not present

## 2016-02-24 DIAGNOSIS — H2511 Age-related nuclear cataract, right eye: Secondary | ICD-10-CM | POA: Diagnosis not present

## 2016-05-10 ENCOUNTER — Other Ambulatory Visit: Payer: Self-pay | Admitting: Internal Medicine

## 2016-05-10 ENCOUNTER — Ambulatory Visit
Admission: RE | Admit: 2016-05-10 | Discharge: 2016-05-10 | Disposition: A | Discharge status: Home / Self Care | Payer: Commercial Managed Care - HMO | Source: Ambulatory Visit | Attending: Internal Medicine | Admitting: Internal Medicine

## 2016-05-10 DIAGNOSIS — M810 Age-related osteoporosis without current pathological fracture: Secondary | ICD-10-CM | POA: Diagnosis not present

## 2016-05-10 DIAGNOSIS — Z0000 Encounter for general adult medical examination without abnormal findings: Secondary | ICD-10-CM | POA: Diagnosis not present

## 2016-05-10 DIAGNOSIS — H43819 Vitreous degeneration, unspecified eye: Secondary | ICD-10-CM | POA: Diagnosis not present

## 2016-05-10 DIAGNOSIS — J209 Acute bronchitis, unspecified: Secondary | ICD-10-CM | POA: Diagnosis not present

## 2016-05-10 DIAGNOSIS — E785 Hyperlipidemia, unspecified: Secondary | ICD-10-CM | POA: Diagnosis not present

## 2016-05-10 DIAGNOSIS — M8580 Other specified disorders of bone density and structure, unspecified site: Secondary | ICD-10-CM | POA: Diagnosis not present

## 2016-05-10 DIAGNOSIS — I10 Essential (primary) hypertension: Secondary | ICD-10-CM | POA: Diagnosis not present

## 2016-05-10 DIAGNOSIS — R05 Cough: Secondary | ICD-10-CM | POA: Diagnosis not present

## 2016-05-10 DIAGNOSIS — Z720 Tobacco use: Secondary | ICD-10-CM | POA: Diagnosis not present

## 2016-05-25 DIAGNOSIS — R938 Abnormal findings on diagnostic imaging of other specified body structures: Secondary | ICD-10-CM | POA: Diagnosis not present

## 2016-05-25 DIAGNOSIS — J209 Acute bronchitis, unspecified: Secondary | ICD-10-CM | POA: Diagnosis not present

## 2016-06-09 ENCOUNTER — Other Ambulatory Visit: Payer: Self-pay | Admitting: Internal Medicine

## 2016-06-09 ENCOUNTER — Ambulatory Visit
Admission: RE | Admit: 2016-06-09 | Discharge: 2016-06-09 | Disposition: A | Discharge status: Home / Self Care | Payer: Commercial Managed Care - HMO | Source: Ambulatory Visit | Attending: Internal Medicine | Admitting: Internal Medicine

## 2016-06-09 DIAGNOSIS — R9389 Abnormal findings on diagnostic imaging of other specified body structures: Secondary | ICD-10-CM

## 2016-06-09 DIAGNOSIS — R05 Cough: Secondary | ICD-10-CM | POA: Diagnosis not present

## 2016-06-09 DIAGNOSIS — Z23 Encounter for immunization: Secondary | ICD-10-CM | POA: Diagnosis not present

## 2016-11-08 DIAGNOSIS — H2512 Age-related nuclear cataract, left eye: Secondary | ICD-10-CM | POA: Diagnosis not present

## 2016-11-08 DIAGNOSIS — Z961 Presence of intraocular lens: Secondary | ICD-10-CM | POA: Diagnosis not present

## 2016-11-08 DIAGNOSIS — H26491 Other secondary cataract, right eye: Secondary | ICD-10-CM | POA: Diagnosis not present

## 2016-11-08 DIAGNOSIS — H35033 Hypertensive retinopathy, bilateral: Secondary | ICD-10-CM | POA: Diagnosis not present

## 2016-11-08 DIAGNOSIS — H25012 Cortical age-related cataract, left eye: Secondary | ICD-10-CM | POA: Diagnosis not present

## 2016-11-08 DIAGNOSIS — H35363 Drusen (degenerative) of macula, bilateral: Secondary | ICD-10-CM | POA: Diagnosis not present

## 2017-07-03 DIAGNOSIS — Z23 Encounter for immunization: Secondary | ICD-10-CM | POA: Diagnosis not present

## 2017-11-10 DIAGNOSIS — H26491 Other secondary cataract, right eye: Secondary | ICD-10-CM | POA: Diagnosis not present

## 2017-11-10 DIAGNOSIS — H35371 Puckering of macula, right eye: Secondary | ICD-10-CM | POA: Diagnosis not present

## 2017-11-10 DIAGNOSIS — Z961 Presence of intraocular lens: Secondary | ICD-10-CM | POA: Diagnosis not present

## 2017-11-10 DIAGNOSIS — H2512 Age-related nuclear cataract, left eye: Secondary | ICD-10-CM | POA: Diagnosis not present

## 2017-12-14 DIAGNOSIS — H26491 Other secondary cataract, right eye: Secondary | ICD-10-CM | POA: Diagnosis not present

## 2018-06-19 DIAGNOSIS — H35371 Puckering of macula, right eye: Secondary | ICD-10-CM | POA: Diagnosis not present

## 2018-06-19 DIAGNOSIS — H25012 Cortical age-related cataract, left eye: Secondary | ICD-10-CM | POA: Diagnosis not present

## 2018-06-19 DIAGNOSIS — H2512 Age-related nuclear cataract, left eye: Secondary | ICD-10-CM | POA: Diagnosis not present

## 2018-06-19 DIAGNOSIS — Z961 Presence of intraocular lens: Secondary | ICD-10-CM | POA: Diagnosis not present

## 2018-07-03 DIAGNOSIS — Z1389 Encounter for screening for other disorder: Secondary | ICD-10-CM | POA: Diagnosis not present

## 2018-07-03 DIAGNOSIS — Z1239 Encounter for other screening for malignant neoplasm of breast: Secondary | ICD-10-CM | POA: Diagnosis not present

## 2018-07-03 DIAGNOSIS — Z0000 Encounter for general adult medical examination without abnormal findings: Secondary | ICD-10-CM | POA: Diagnosis not present

## 2018-07-03 DIAGNOSIS — M85852 Other specified disorders of bone density and structure, left thigh: Secondary | ICD-10-CM | POA: Diagnosis not present

## 2018-07-03 DIAGNOSIS — E7800 Pure hypercholesterolemia, unspecified: Secondary | ICD-10-CM | POA: Diagnosis not present

## 2018-07-03 DIAGNOSIS — S76012A Strain of muscle, fascia and tendon of left hip, initial encounter: Secondary | ICD-10-CM | POA: Diagnosis not present

## 2018-07-03 DIAGNOSIS — Z1159 Encounter for screening for other viral diseases: Secondary | ICD-10-CM | POA: Diagnosis not present

## 2018-07-03 DIAGNOSIS — Z23 Encounter for immunization: Secondary | ICD-10-CM | POA: Diagnosis not present

## 2018-07-03 DIAGNOSIS — E559 Vitamin D deficiency, unspecified: Secondary | ICD-10-CM | POA: Diagnosis not present

## 2018-07-03 DIAGNOSIS — F4321 Adjustment disorder with depressed mood: Secondary | ICD-10-CM | POA: Diagnosis not present

## 2018-07-03 DIAGNOSIS — I10 Essential (primary) hypertension: Secondary | ICD-10-CM | POA: Diagnosis not present

## 2018-07-03 DIAGNOSIS — M79661 Pain in right lower leg: Secondary | ICD-10-CM | POA: Diagnosis not present

## 2018-07-04 NOTE — Progress Notes (Addendum)
[facility] Note  07/06/2018     CHIEF COMPLAINT Patient presents for Retina Evaluation   HISTORY OF PRESENT ILLNESS: Jaclyn Golden is a 68 y.o. female who presents to the clinic today for:   HPI    Retina Evaluation    In both eyes.  This started 2 years ago.  Associated Symptoms Distortion and Photophobia.  Negative for Flashes, Pain, Trauma, Floaters, Redness, Blind Spot, Glare, Shoulder/Hip pain, Fatigue, Jaw Claudication, Weight Loss, Scalp Tenderness and Fever.  Context:  distance vision, mid-range vision and near vision.  Treatments tried include surgery.  Response to treatment was moderate improvement.  I, the attending physician,  performed the HPI with the patient and updated documentation appropriately.          Comments    Referral of DR. Weaver for retina eval. Patient states after having OD cataract sx  two yrs ago her vision had improved but she had wave lines and everything was out of focus after sx . Pt states sun light also bothers her eyes. Denies flashes and ocular pain. Denies gtt's       Last edited by Bernarda Caffey, MD on 07/06/2018  9:42 AM. (History)    pt had macular hole repair by Dr. Zadie Rhine, pt states she was released from his care last year, pt also had cataract sx OD by Dr. Quentin Ore  Referring physician: Hortencia Pilar, MD [place],  [id number]  HISTORICAL INFORMATION:   Selected notes from the MEDICAL RECORD NUMBER Referred by Dr. Quentin Ore for second opinion of closed full thickness macular hole LEE: 12.31.19 (C. Weaver) [BCVA: OD: 20/30- OS: 20/20-] Ocular Hx-cataracts OS, drusen OS, PVD OD, pseudo OD,ERM OD, pinguecula, PPV (2017), YAG PMH-arthritis, HTN    CURRENT MEDICATIONS: Current Outpatient Medications (Ophthalmic Drugs)  Medication Sig  . prednisoLONE acetate (PRED FORTE) 1 % ophthalmic suspension Place 1 drop into the right eye 2 (two) times daily.  Marland Kitchen tobramycin  (TOBREX) 0.3 % ophthalmic solution Place 1 drop into the right eye 2 (two) times daily.   No current facility-administered medications for this visit.  (Ophthalmic Drugs)   Current Outpatient Medications (Other)  Medication Sig  . acetaminophen (TYLENOL) 500 MG tablet Take 1,000 mg by mouth every 6 (six) hours as needed (for pain.).  Marland Kitchen hydrochlorothiazide (MICROZIDE) 12.5 MG capsule Take 12.5 mg by mouth.   . naproxen (NAPROSYN) 500 MG tablet Take 500 mg by mouth 2 (two) times daily as needed. For inflammation   No current facility-administered medications for this visit.  (Other)      REVIEW OF SYSTEMS: ROS    Positive for: Eyes   Negative for: Constitutional, Gastrointestinal, Neurological, Skin, Genitourinary, Musculoskeletal, HENT, Endocrine, Cardiovascular, Respiratory, Psychiatric, Allergic/Imm, Heme/Lymph   Last edited by Zenovia Jordan, LPN on 1/74/0814  4:81 AM. (History)       ALLERGIES Allergies  Allergen Reactions  . Penicillins Other (See Comments)    Pass out   Has patient had a PCN reaction causing immediate rash, facial/tongue/throat swelling, SOB or lightheadedness with hypotension:YES Has patient had a PCN reaction causing severe rash involving mucus membranes or skin necrosis:No Has patient had a PCN reaction that required hospitalization:NO Has patient had a PCN reaction occurring within the last 10 years:NO If all of the above answers are "NO", then may proceed with Cephalosporin use.     PAST MEDICAL HISTORY Past Medical History:  Diagnosis Date  . Arthritis  right ankle, wears brace prn  . Hypertension   . Pain in head    occasional pain back of head on right for last couple of years, to see neurlogist for    Past Surgical History:  Procedure Laterality Date  . APPENDECTOMY  1980's   ruptured  . BREAST BIOPSY Left 1980  . CATARACT EXTRACTION    . COLONOSCOPY WITH PROPOFOL N/A 02/11/2014   Procedure: COLONOSCOPY WITH PROPOFOL;  Surgeon:  Garlan Fair, MD;  Location: [facility];  Service: Endoscopy;  Laterality: N/A;  . EYE SURGERY    . TUBAL LIGATION  1979    FAMILY HISTORY Family History  Problem Relation Age of Onset  . Stroke Mother   . Anuerysm Brother   . Heart failure Brother   . Cancer Father     SOCIAL HISTORY Social History   Tobacco Use  . Smoking status: Current Every Day Smoker    Packs/day: 0.50    Years: 47.00    Pack years: 23.50    Types: Cigarettes  . Smokeless tobacco: Never Used  . Tobacco comment: patient is aware she needs to quit   Substance Use Topics  . Alcohol use: No  . Drug use: No         OPHTHALMIC EXAM:  Base Eye Exam    Visual Acuity (Snellen - Linear)      Right Left   Dist [place] 20/30 +2 20/25 -2   Dist ph [place] NI NI       Tonometry (Tonopen, 8:51 AM)      Right Left   Pressure 14 12       Pupils      Dark Light Shape React APD   Right 3 2 Round Brisk None   Left 3 2 Round Brisk None       Visual Fields      Left Right    Full Full       Extraocular Movement      Right Left    Full, Ortho Full, Ortho       Neuro/Psych    Oriented x3:  Yes       Dilation    Both eyes:  1.0% Mydriacyl, 2.5% Phenylephrine @ 8:46 AM        Slit Lamp and Fundus Exam    Slit Lamp Exam      Right Left   Lids/Lashes Dermatochalasis - upper lid, Meibomian gland dysfunction Dermatochalasis - upper lid, Meibomian gland dysfunction   Conjunctiva/Sclera White and quiet White and quiet   Cornea Well healed cataract wounds 1+ Punctate epithelial erosions   Anterior Chamber deep and clear deep and clear   Iris Round and moderately dilated to 4.35m Round and moderately dilated to 4.57m  Lens PC IOL in good position with open PC 2+ Nuclear sclerosis, 2+ Cortical cataract   Vitreous post vitrectomy Vitreous syneresis       Fundus Exam      Right Left   Disc Pink and Sharp Pink and Sharp   C/D Ratio 0.4 0.4   Macula Flat, Good foveal reflex, closed macular hole,  Retinal pigment epithelial mottling, No heme or edema Flat, Good foveal reflex, Retinal pigment epithelial mottling, No heme or edema   Vessels Vascular attenuation, Tortuous, AV crossing changes Vascular attenuation, Tortuous   Periphery Attached, mild reticular degeneration attached; mild reticular degen          IMAGING AND PROCEDURES  Imaging and Procedures for _0 @  OCT, Retina - OU - Both Eyes       Right Eye Quality was good. Central Foveal Thickness: 279. Progression has improved. Findings include no IRF, no SRF (Mac hole closed).   Left Eye Quality was good. Central Foveal Thickness: 220. Progression has been stable. Findings include no IRF, no SRF, abnormal foveal contour (Tr ERM).   Notes *Images captured and stored on drive  Diagnosis / Impression:  OD: closed macular hole OS: mildly abnormal foveal profile; no IRF/SRF  Clinical management:  See below  Abbreviations: NFP - Normal foveal profile. CME - cystoid macular edema. PED - pigment epithelial detachment. IRF - intraretinal fluid. SRF - subretinal fluid. EZ - ellipsoid zone. ERM - epiretinal membrane. ORA - outer retinal atrophy. ORT - outer retinal tubulation. SRHM - subretinal hyper-reflective material                 ASSESSMENT/PLAN:    ICD-10-CM   1. Macular hole of right eye H35.341   2. Retinal edema H35.81 OCT, Retina - OU - Both Eyes  3. Essential hypertension I10   4. Hypertensive retinopathy of both eyes H35.033   5. Pseudophakia, right eye Z96.1   6. 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 Macular Hole OD - s/p PPV, macular hole repair OD by Dr. Zadie Rhine, 2017 - beautiful surgery with excellent result - BCVA 20/30  2. No retinal edema on exam or OCT OU  3,4. Hypertensive retinopathy OU - discussed importance of tight BP control - monitor  6. Pseudophakia OD  - sx performed by expert surgeon, Dr. Read Drivers  - s/p CE/IOL -- PCIOL in excellent  position  - beautiful surgery, doing well  - monitor  7. Mixed form age related cataract OS - The symptoms of cataract, surgical options, and treatments and risks were discussed with patient. - discussed diagnosis and progression - under the expert management of Dr. Read Drivers    Ophthalmic Meds Ordered this visit:  No orders of the defined types were placed in this encounter.      Return if symptoms worsen or fail to improve.  There are no Patient Instructions on file for this visit.   Explained the diagnoses, plan, and follow up with the patient and they expressed understanding.  Patient expressed understanding of the importance of proper follow up care.   This document serves as a record of services personally performed by Gardiner Sleeper, MD, PhD. It was created on their behalf by Ernest Mallick, OA, an ophthalmic assistant. The creation of this record is the provider's dictation and/or activities during the visit.    Electronically signed by: Ernest Mallick, OA  01.15.2020 8:12 AM    Gardiner Sleeper, M.D., Ph.D. Diseases & Surgery of the Retina and Vitreous [facility]  I have reviewed the above documentation for accuracy and completeness, and I agree with the above. Gardiner Sleeper, M.D., Ph.D. 07/09/18 8:12 AM    Abbreviations: M myopia (nearsighted); A astigmatism; H hyperopia (farsighted); P presbyopia; Mrx spectacle prescription;  CTL contact lenses; OD right eye; OS left eye; OU both eyes  XT exotropia; ET esotropia; PEK punctate epithelial keratitis; PEE punctate epithelial erosions; DES dry eye syndrome; MGD meibomian gland dysfunction; ATs artificial tears; PFAT's preservative free artificial tears; [place] nuclear sclerotic cataract; PSC posterior subcapsular cataract; ERM epi-retinal membrane; PVD posterior vitreous detachment; RD retinal detachment; DM diabetes mellitus; DR diabetic retinopathy; NPDR non-proliferative diabetic  retinopathy; PDR  proliferative diabetic retinopathy; CSME clinically significant macular edema; DME diabetic macular edema; dbh dot blot hemorrhages; CWS cotton wool spot; POAG primary open angle glaucoma; C/D cup-to-disc ratio; HVF humphrey visual field; GVF goldmann visual field; OCT optical coherence tomography; IOP intraocular pressure; BRVO Branch retinal vein occlusion; CRVO central retinal vein occlusion; CRAO central retinal artery occlusion; BRAO branch retinal artery occlusion; RT retinal tear; SB scleral buckle; PPV pars plana vitrectomy; VH Vitreous hemorrhage; PRP panretinal laser photocoagulation; IVK intravitreal kenalog; VMT vitreomacular traction; MH Macular hole;  NVD neovascularization of the disc; NVE neovascularization elsewhere; AREDS age related eye disease study; ARMD age related macular degeneration; POAG primary open angle glaucoma; EBMD epithelial/anterior basement membrane dystrophy; ACIOL anterior chamber intraocular lens; IOL intraocular lens; PCIOL posterior chamber intraocular lens; Phaco/IOL phacoemulsification with intraocular lens placement; [place] photorefractive keratectomy; LASIK laser assisted in situ keratomileusis; HTN hypertension; DM diabetes mellitus; COPD chronic obstructive pulmonary disease

## 2018-07-06 ENCOUNTER — Encounter (INDEPENDENT_AMBULATORY_CARE_PROVIDER_SITE_OTHER): Payer: Self-pay | Admitting: Ophthalmology

## 2018-07-06 ENCOUNTER — Ambulatory Visit (INDEPENDENT_AMBULATORY_CARE_PROVIDER_SITE_OTHER): Payer: Medicare HMO | Admitting: Ophthalmology

## 2018-07-06 DIAGNOSIS — H35033 Hypertensive retinopathy, bilateral: Secondary | ICD-10-CM

## 2018-07-06 DIAGNOSIS — I10 Essential (primary) hypertension: Secondary | ICD-10-CM

## 2018-07-06 DIAGNOSIS — H3581 Retinal edema: Secondary | ICD-10-CM

## 2018-07-06 DIAGNOSIS — H25812 Combined forms of age-related cataract, left eye: Secondary | ICD-10-CM | POA: Diagnosis not present

## 2018-07-06 DIAGNOSIS — Z961 Presence of intraocular lens: Secondary | ICD-10-CM

## 2018-07-06 DIAGNOSIS — H35341 Macular cyst, hole, or pseudohole, right eye: Secondary | ICD-10-CM

## 2018-07-07 ENCOUNTER — Encounter (INDEPENDENT_AMBULATORY_CARE_PROVIDER_SITE_OTHER): Payer: Self-pay | Admitting: Ophthalmology

## 2018-07-09 ENCOUNTER — Ambulatory Visit (INDEPENDENT_AMBULATORY_CARE_PROVIDER_SITE_OTHER): Payer: Medicare HMO

## 2018-07-09 ENCOUNTER — Encounter (INDEPENDENT_AMBULATORY_CARE_PROVIDER_SITE_OTHER): Payer: Self-pay | Admitting: Family Medicine

## 2018-07-09 ENCOUNTER — Ambulatory Visit (INDEPENDENT_AMBULATORY_CARE_PROVIDER_SITE_OTHER): Payer: Medicare HMO | Admitting: Family Medicine

## 2018-07-09 DIAGNOSIS — I10 Essential (primary) hypertension: Secondary | ICD-10-CM | POA: Insufficient documentation

## 2018-07-09 DIAGNOSIS — E785 Hyperlipidemia, unspecified: Secondary | ICD-10-CM | POA: Insufficient documentation

## 2018-07-09 DIAGNOSIS — Z961 Presence of intraocular lens: Secondary | ICD-10-CM | POA: Insufficient documentation

## 2018-07-09 DIAGNOSIS — M79604 Pain in right leg: Secondary | ICD-10-CM

## 2018-07-09 DIAGNOSIS — Z9841 Cataract extraction status, right eye: Secondary | ICD-10-CM

## 2018-07-09 NOTE — Progress Notes (Signed)
Office Visit Note   Patient: Jaclyn Golden           Date of Birth: [birth year]           MRN: [id number] Visit Date: 07/09/2018 Requested by: Orland Penman, MD [id number] [place], [place] [address] PCP: Lonzo Cloud, Konrad Dolores, MD  Subjective: Chief Complaint  Patient presents with  . Right Lower Leg - Pain    Pain lateral lower leg x approximately 5 years, post fall. Went to [facility] approximately 2 years ago and was given cortisone injection in right ankle - helped.    HPI: She is a 68 year old with right lower leg pain.  Symptoms started about 3 years ago, she fell hard on her right side.  She had pain on the lateral aspect of her leg.  She went to another clinic and had x-rays done which were negative per her report.  She had an MRI and x-ray of her ankle showing osteoarthritis.  At one point she was given an injection in her ankle and another injection a little bit higher up on her leg, both of these helped for a little while but never got rid of her pain completely.  Denies any numbness or tingling in her leg.  Denies any low back pain or radicular symptoms.  She also states that she went to physical therapy for a while but it did not help.              ROS: Otherwise noncontributory as it pertains to chief complaint.  Objective: Vital Signs: There were no vitals taken for this visit.  Physical Exam:  Right leg: No tenderness lumbar spine, negative straight leg raise.  No pain in the sciatic notch.  Lower extremity strength and reflexes are normal with the exception of right ankle eversion which is weak at 4/5 compared to 5/5 on the left.  There is some tenderness to palpation of the proximal to mid fibula.  No crepitation or step-off, no palpable mass.  Imaging: X-rays tibia/fibula: No sign of previous fibular fracture other than old lateral malleolus avulsion fracture which was evident on x-rays from other orthopedic clinic a few  years ago.  She also has DJD on the medial and lateral ankle joint.  Tibiofemoral joint looks well-preserved.  No sign of neoplasm.    Assessment & Plan: 1.  Chronic right lower leg pain status post fall, with ankle eversion weakness concerning for common peroneal nerve contusion injury -Discussed options with patient and elected to proceed with nerve conduction studies.  Depending on the results, we could try another round of PT at [facility] physical therapy or possibly refer her for surgical consult.   Follow-Up Instructions: No follow-ups on file.      Procedures: No procedures performed  No notes on file    PMFS History: Patient Active Problem List   Diagnosis Date Noted  . Essential (primary) hypertension 07/09/2018  . HLD (hyperlipidemia) 07/09/2018  . Status post cataract extraction and insertion of intraocular lens of right eye 07/09/2018  . Hyperglycemia 11/24/1967   Past Medical History:  Diagnosis Date  . Arthritis    right ankle, wears brace prn  . Hypertension   . Pain in head    occasional pain back of head on right for last couple of years, to see neurlogist for     Family History  Problem Relation Age of Onset  . Stroke Mother   . Anuerysm Brother   .  Heart failure Brother   . Cancer Father     Past Surgical History:  Procedure Laterality Date  . APPENDECTOMY  1980's   ruptured  . BREAST BIOPSY Left 1980  . CATARACT EXTRACTION    . COLONOSCOPY WITH PROPOFOL N/A 02/11/2014   Procedure: COLONOSCOPY WITH PROPOFOL;  Surgeon: Charolett Bumpers, MD;  Location: [facility];  Service: Endoscopy;  Laterality: N/A;  . EYE SURGERY    . TUBAL LIGATION  1979   Social History   Occupational History  . Not on file  Tobacco Use  . Smoking status: Current Every Day Smoker    Packs/day: 0.50    Years: 47.00    Pack years: 23.50    Types: Cigarettes  . Smokeless tobacco: Never Used  . Tobacco comment: patient is aware she needs to quit   Substance and  Sexual Activity  . Alcohol use: No  . Drug use: No  . Sexual activity: Never

## 2018-07-10 ENCOUNTER — Other Ambulatory Visit: Payer: Self-pay | Admitting: Acute Care

## 2018-07-10 DIAGNOSIS — F17210 Nicotine dependence, cigarettes, uncomplicated: Principal | ICD-10-CM

## 2018-07-10 DIAGNOSIS — Z122 Encounter for screening for malignant neoplasm of respiratory organs: Secondary | ICD-10-CM

## 2018-07-10 DIAGNOSIS — Z87891 Personal history of nicotine dependence: Secondary | ICD-10-CM

## 2018-07-18 DIAGNOSIS — M8589 Other specified disorders of bone density and structure, multiple sites: Secondary | ICD-10-CM | POA: Diagnosis not present

## 2018-07-18 DIAGNOSIS — Z1231 Encounter for screening mammogram for malignant neoplasm of breast: Secondary | ICD-10-CM | POA: Diagnosis not present

## 2018-07-18 DIAGNOSIS — Z780 Asymptomatic menopausal state: Secondary | ICD-10-CM | POA: Diagnosis not present

## 2018-07-23 ENCOUNTER — Telehealth: Payer: Self-pay | Admitting: Acute Care

## 2018-07-24 NOTE — Telephone Encounter (Signed)
LMOM for pt [facility].  We will contact pt closer to April 2020 to get her rescheduled.

## 2018-07-25 ENCOUNTER — Ambulatory Visit (INDEPENDENT_AMBULATORY_CARE_PROVIDER_SITE_OTHER): Payer: Medicare HMO | Admitting: Physical Medicine and Rehabilitation

## 2018-07-25 ENCOUNTER — Encounter (INDEPENDENT_AMBULATORY_CARE_PROVIDER_SITE_OTHER): Payer: Self-pay | Admitting: Physical Medicine and Rehabilitation

## 2018-07-25 DIAGNOSIS — R202 Paresthesia of skin: Secondary | ICD-10-CM

## 2018-07-25 DIAGNOSIS — R531 Weakness: Secondary | ICD-10-CM

## 2018-07-25 NOTE — Progress Notes (Signed)
.  Numeric Pain Rating Scale and Functional Assessment Average Pain 6   In the last MONTH (on 0-10 scale) has pain interfered with the following?  1. General activity like being  able to carry out your everyday physical activities such as walking, climbing stairs, carrying groceries, or moving a chair?  Rating(5)   

## 2018-07-26 ENCOUNTER — Ambulatory Visit (INDEPENDENT_AMBULATORY_CARE_PROVIDER_SITE_OTHER): Payer: Self-pay | Admitting: Family Medicine

## 2018-07-26 NOTE — Progress Notes (Signed)
Jaclyn Golden - 68 y.o. female MRN [id number]  Date of birth: 1951/02/14  Office Visit Note: Visit Date: 07/25/2018 PCP: Orland Penman, MD Referred by: Aaron Mose*  Subjective: Chief Complaint  Patient presents with  . Right Lower Leg - Pain  . Right Ankle - Pain   HPI: Jaclyn Golden is a 68 y.o. female who comes in today At the request of Dr. Lavada Mesi for electrodiagnostic study of the right lower limb.  Patient reports significant fall type injury 4 years ago.  Her husband at the time had pretty significant Parkinson's disease and history of lumbar surgery and at that time was unable to move his legs and was having trouble and she was rushing to get to him and had a fall.  This seemed like a fairly traumatic event overall.  Since that time she has had significant ankle pain on the right.  The ankle pain may have subsided a little bit and now she has more right anterior lateral posterior lateral calf pain.  She tries to definitively state that this all occurred after the fall.  She denies any left-sided symptoms.  She does not endorse frank numbness or tingling but does get a weird sensation at times.  She feels like at times her whole leg does not want to move and it feels heavy.  She has had no back pain and does not feel like this is a spine problem.  Again she relates it to the fall.  She has not gotten better with conservative care and was under the care of other physicians before seeing Dr.Hilts.  She has not had prior electrodiagnostic study or MRI of the lumbar spine.  She has had images of the lower leg.  ROS Otherwise per HPI.  Assessment & Plan: Visit Diagnoses:  1. Paresthesia of skin   2. Weakness     Plan: Impression: The above electrodiagnostic study is ABNORMAL and reveals evidence of mild chronic L5 and S1 radiculopathy on the right.    There is no significant electrodiagnostic evidence of any other focal nerve entrapment or lumbar  radiculopathy.  Specifically no findings of peroneal or fibular nerve entrapment.  Recommendations: 1.  Follow-up with referring physician. 2.  Continue current management of symptoms. 3.  Suggest MRI lumbar spine versus diagnostic epidural injection.  Patient is pretty adamant that this is not her spine and this is not fully diagnostic that it is but at least is an avenue to look at.   Meds & Orders: No orders of the defined types were placed in this encounter.   Orders Placed This Encounter  Procedures  . NCV with EMG (electromyography)    Follow-up: Return for Lavada Mesi, MD.   Procedures: No procedures performed  EMG & NCV Findings: Evaluation of the right tibial motor nerve showed reduced amplitude (2.5 mV).  All remaining nerves (as indicated in the following tables) were within normal limits.    Needle evaluation of the right anterior tibialis muscle showed increased insertional activity, increased motor unit amplitude, and diminished recruitment.  The right medial gastrocnemius muscle showed increased insertional activity.  All remaining muscles (as indicated in the following table) showed no evidence of electrical instability.    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    There is no significant electrodiagnostic evidence of any other focal nerve entrapment or lumbar radiculopathy.  Specifically no findings of peroneal  or fibular nerve entrapment.  Recommendations: 1.  Follow-up with referring physician. 2.  Continue current management of symptoms. 3.  Suggest MRI lumbar spine versus diagnostic epidural injection.  Patient is pretty adamant that this is not her spine and this is not fully diagnostic that it is but at least is an avenue to look at.  ___________________________ Elease HashimotoFred Janthony Holleman FAAPMR Board Certified, American Board of Physical Medicine and Rehabilitation    Nerve  Conduction Studies Anti Sensory Summary Table   Stim Site NR Peak (ms) Norm Peak (ms) P-T Amp (V) Norm P-T Amp Site1 Site2 Delta-P (ms) Dist (cm) Vel (m/s) Norm Vel (m/s)  Right Saphenous Anti Sensory (Ant Med Mall)  30.4C  14cm    3.5 <4.4 13.6 >2 14cm Ant Med Mall 3.5 0.0  >32  Right Sup Fibular Anti Sensory (Ant Lat Mall)  30.4C  14 cm    3.6 <4.4 13.0 >5.0 14 cm Ant Lat Mall 3.6 14.0 39 >32  Right Sural Anti Sensory (Lat Mall)  30.2C  Calf    3.5 <4.0 16.5 >5.0 Calf Lat Mall 3.5 14.0 40 >35   Motor Summary Table   Stim Site NR Onset (ms) Norm Onset (ms) O-P Amp (mV) Norm O-P Amp Site1 Site2 Delta-0 (ms) Dist (cm) Vel (m/s) Norm Vel (m/s)  Right Fibular Motor (Ext Dig Brev)  30.3C  Ankle    3.9 <6.1 2.8 >2.5 B Fib Ankle 5.2 20.0 38 >38  B Fib    9.1  3.1  Poplt B Fib 2.0 9.0 45 >40  Poplt    11.1  3.0         Right Tibial Motor (Abd Hall Brev)  30.2C  Ankle    4.7 <6.1 *2.5 >3.0 Knee Ankle 6.6 24.0 36 >35  Knee    11.3  6.9          EMG   Side Muscle Nerve Root Ins Act Fibs Psw Amp Dur Poly Recrt Int Dennie BiblePat Comment  Right AntTibialis Dp Br Peron L4-5 *Incr Nml Nml *Incr Nml 0 *Reduced Nml   Right Fibularis Longus  Sup Br Peron L5-S1 Nml Nml Nml Nml Nml 0 Nml Nml   Right MedGastroc Tibial S1-2 *Incr Nml Nml Nml Nml 0 Nml Nml   Right VastusMed Femoral L2-4 Nml Nml Nml Nml Nml 0 Nml Nml   Right BicepsFemS Sciatic L5-S1 Nml Nml Nml Nml Nml 0 Nml Nml     Nerve Conduction Studies Anti Sensory Left/Right Comparison   Stim Site L Lat (ms) R Lat (ms) L-R Lat (ms) L Amp (V) R Amp (V) L-R Amp (%) Site1 Site2 L Vel (m/s) R Vel (m/s) L-R Vel (m/s)  Saphenous Anti Sensory (Ant Med Mall)  30.4C  14cm  3.5   13.6  14cm Ant Med Mall     Sup Fibular Anti Sensory (Ant Lat Mall)  30.4C  14 cm  3.6   13.0  14 cm Ant Lat Mall  39   Sural Anti Sensory (Lat Mall)  30.2C  Calf  3.5   16.5  Calf Lat Mall  40    Motor Left/Right Comparison   Stim Site L Lat (ms) R Lat (ms) L-R Lat (ms) L  Amp (mV) R Amp (mV) L-R Amp (%) Site1 Site2 L Vel (m/s) R Vel (m/s) L-R Vel (m/s)  Fibular Motor (Ext Dig Brev)  30.3C  Ankle  3.9   2.8  B Fib Ankle  38   B Fib  9.1  3.1  Poplt B Fib  45   Poplt  11.1   3.0        Tibial Motor (Abd Hall Brev)  30.2C  Ankle  4.7   *2.5  Knee Ankle  36   Knee  11.3   6.9           Waveforms:            Clinical History: No specialty comments available.   She reports that she has been smoking cigarettes. She has a 23.50 pack-year smoking history. She has never used smokeless tobacco. No results for input(s): HGBA1C, LABURIC in the last 8760 hours.  Objective:  VS:  HT:    WT:   BMI:     BP:   HR: bpm  TEMP: ( )  RESP:  Physical Exam Constitutional:      Appearance: Normal appearance. She is obese.  Musculoskeletal:     Right lower leg: No edema.     Left lower leg: No edema.     Comments: Inspection reveals mild atrophy of the bilateral foot intrinsic musculature without significant edema or allodynia or discoloration.  She appears to have decent strength with dorsiflexion plantarflexion EHL.  She does not have any decreased sensation in all dermatomal and peripheral nerve patterns seem to be intact.  She has pain with movement of the leg both active and passive.  Skin:    General: Skin is warm and dry.     Findings: No erythema or rash.  Neurological:     General: No focal deficit present.     Mental Status: She is alert and oriented to person, place, and time.     Sensory: No sensory deficit.     Motor: No weakness.     Coordination: Coordination normal.     Gait: Gait abnormal.  Psychiatric:        Mood and Affect: Mood normal.        Behavior: Behavior normal.     Ortho Exam Imaging: No results found.  Past Medical/Family/Surgical/Social History: Medications & Allergies reviewed per EMR, new medications updated. Patient Active Problem List   Diagnosis Date Noted  . Essential (primary) hypertension 07/09/2018  . HLD  (hyperlipidemia) 07/09/2018  . Status post cataract extraction and insertion of intraocular lens of right eye 07/09/2018  . Hyperglycemia 11/24/1967   Past Medical History:  Diagnosis Date  . Arthritis    right ankle, wears brace prn  . Hypertension   . Pain in head    occasional pain back of head on right for last couple of years, to see neurlogist for    Family History  Problem Relation Age of Onset  . Stroke Mother   . Anuerysm Brother   . Heart failure Brother   . Cancer Father    Past Surgical History:  Procedure Laterality Date  . APPENDECTOMY  1980's   ruptured  . BREAST BIOPSY Left 1980  . CATARACT EXTRACTION    . COLONOSCOPY WITH PROPOFOL N/A 02/11/2014   Procedure: COLONOSCOPY WITH PROPOFOL;  Surgeon: Charolett BumpersMartin K Johnson, MD;  Location: [facility];  Service: Endoscopy;  Laterality: N/A;  . EYE SURGERY    . TUBAL LIGATION  1979   Social History   Occupational History  . Not on file  Tobacco Use  . Smoking status: Current Every Day Smoker    Packs/day: 0.50    Years: 47.00    Pack years: 23.50    Types: Cigarettes  . Smokeless tobacco:  Never Used  . Tobacco comment: patient is aware she needs to quit   Substance and Sexual Activity  . Alcohol use: No  . Drug use: No  . Sexual activity: Never

## 2018-07-27 ENCOUNTER — Ambulatory Visit (INDEPENDENT_AMBULATORY_CARE_PROVIDER_SITE_OTHER): Payer: Self-pay | Admitting: Family Medicine

## 2018-07-27 NOTE — Procedures (Signed)
EMG & NCV Findings: Evaluation of the right tibial motor nerve showed reduced amplitude (2.5 mV).  All remaining nerves (as indicated in the following tables) were within normal limits.    Needle evaluation of the right anterior tibialis muscle showed increased insertional activity, increased motor unit amplitude, and diminished recruitment.  The right medial gastrocnemius muscle showed increased insertional activity.  All remaining muscles (as indicated in the following table) showed no evidence of electrical instability.    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    There is no significant electrodiagnostic evidence of any other focal nerve entrapment or lumbar radiculopathy.  Specifically no findings of peroneal or fibular nerve entrapment.  Recommendations: 1.  Follow-up with referring physician. 2.  Continue current management of symptoms. 3.  Suggest MRI lumbar spine versus diagnostic epidural injection.  Patient is pretty adamant that this is not her spine and this is not fully diagnostic that it is but at least is an avenue to look at.  ___________________________ Elease Hashimoto Board Certified, American Board of Physical Medicine and Rehabilitation    Nerve Conduction Studies Anti Sensory Summary Table   Stim Site NR Peak (ms) Norm Peak (ms) P-T Amp (V) Norm P-T Amp Site1 Site2 Delta-P (ms) Dist (cm) Vel (m/s) Norm Vel (m/s)  Right Saphenous Anti Sensory (Ant Med Mall)  30.4C  14cm    3.5 <4.4 13.6 >2 14cm Ant Med Mall 3.5 0.0  >32  Right Sup Fibular Anti Sensory (Ant Lat Mall)  30.4C  14 cm    3.6 <4.4 13.0 >5.0 14 cm Ant Lat Mall 3.6 14.0 39 >32  Right Sural Anti Sensory (Lat Mall)  30.2C  Calf    3.5 <4.0 16.5 >5.0 Calf Lat Mall 3.5 14.0 40 >35   Motor Summary Table   Stim Site NR Onset (ms) Norm Onset (ms) O-P Amp (mV) Norm O-P Amp Site1 Site2 Delta-0 (ms) Dist (cm) Vel  (m/s) Norm Vel (m/s)  Right Fibular Motor (Ext Dig Brev)  30.3C  Ankle    3.9 <6.1 2.8 >2.5 B Fib Ankle 5.2 20.0 38 >38  B Fib    9.1  3.1  Poplt B Fib 2.0 9.0 45 >40  Poplt    11.1  3.0         Right Tibial Motor (Abd Hall Brev)  30.2C  Ankle    4.7 <6.1 *2.5 >3.0 Knee Ankle 6.6 24.0 36 >35  Knee    11.3  6.9          EMG   Side Muscle Nerve Root Ins Act Fibs Psw Amp Dur Poly Recrt Int Dennie Bible Comment  Right AntTibialis Dp Br Peron L4-5 *Incr Nml Nml *Incr Nml 0 *Reduced Nml   Right Fibularis Longus  Sup Br Peron L5-S1 Nml Nml Nml Nml Nml 0 Nml Nml   Right MedGastroc Tibial S1-2 *Incr Nml Nml Nml Nml 0 Nml Nml   Right VastusMed Femoral L2-4 Nml Nml Nml Nml Nml 0 Nml Nml   Right BicepsFemS Sciatic L5-S1 Nml Nml Nml Nml Nml 0 Nml Nml     Nerve Conduction Studies Anti Sensory Left/Right Comparison   Stim Site L Lat (ms) R Lat (ms) L-R Lat (ms) L Amp (V) R Amp (V) L-R Amp (%) Site1 Site2 L Vel (m/s) R Vel (m/s) L-R Vel (m/s)  Saphenous Anti Sensory (Ant Med Mall)  30.4C  14cm  3.5   13.6  14cm Ant Med Mall     Sup Fibular Anti Sensory (Ant Lat Mall)  30.4C  14 cm  3.6   13.0  14 cm Ant Lat Mall  39   Sural Anti Sensory (Lat Mall)  30.2C  Calf  3.5   16.5  Calf Lat Mall  40    Motor Left/Right Comparison   Stim Site L Lat (ms) R Lat (ms) L-R Lat (ms) L Amp (mV) R Amp (mV) L-R Amp (%) Site1 Site2 L Vel (m/s) R Vel (m/s) L-R Vel (m/s)  Fibular Motor (Ext Dig Brev)  30.3C  Ankle  3.9   2.8  B Fib Ankle  38   B Fib  9.1   3.1  Poplt B Fib  45   Poplt  11.1   3.0        Tibial Motor (Abd Hall Brev)  30.2C  Ankle  4.7   *2.5  Knee Ankle  36   Knee  11.3   6.9           Waveforms:

## 2018-08-01 ENCOUNTER — Ambulatory Visit (INDEPENDENT_AMBULATORY_CARE_PROVIDER_SITE_OTHER): Payer: Medicare HMO | Admitting: Family Medicine

## 2018-08-01 ENCOUNTER — Encounter (INDEPENDENT_AMBULATORY_CARE_PROVIDER_SITE_OTHER): Payer: Self-pay | Admitting: Family Medicine

## 2018-08-01 ENCOUNTER — Inpatient Hospital Stay: Admission: RE | Admit: 2018-08-01 | Payer: Self-pay | Source: Ambulatory Visit

## 2018-08-01 ENCOUNTER — Encounter: Payer: Self-pay | Admitting: Acute Care

## 2018-08-01 DIAGNOSIS — G8929 Other chronic pain: Secondary | ICD-10-CM

## 2018-08-01 DIAGNOSIS — M79604 Pain in right leg: Secondary | ICD-10-CM | POA: Diagnosis not present

## 2018-08-01 MED ORDER — ETODOLAC 400 MG PO TABS: 400.0000 mg | ORAL_TABLET | Freq: Two times a day (BID) | ORAL | 3 refills | Status: DC | PRN

## 2018-08-01 NOTE — Progress Notes (Signed)
   Office Visit Note   Patient: Jaclyn Golden           Date of Birth: 1951-04-30           MRN: [id number] Visit Date: 08/01/2018 Requested by: Orland Penman, MD [address] [place], [place] [address] PCP: Lonzo Cloud, Konrad Dolores, MD  Subjective: Chief Complaint  Patient presents with  . Right Leg - Follow-up    Post NCV/EMG    HPI: She is here to discuss nerve study results.  Her chronic right leg pain has not improved.  Nerve studies showed mild chronic L5 and S1 radiculopathy on the right.  No sign of peroneal nerve entrapment.              ROS: Noncontributory  Objective: Vital Signs: There were no vitals taken for this visit.  Physical Exam:  Right leg: No pain with stork test on either side.  Straight leg raise negative.  Lower extremity strength and reflexes remain normal except for ankle eversion on the right.  She still tender just distal to the right fibular head.  Imaging: None today.  Assessment & Plan: 1.  Chronic right leg pain with evidence of L5 and S1 radiculopathy per nerve studies -Discussed options with her, she wants to try physical therapy first.  We will try Lodine for inflammation as well.  If she fails to improve we will order lumbar MRI scan followed by possible epidural injections.   Follow-Up Instructions: No follow-ups on file.      Procedures: No procedures performed  No notes on file    PMFS History: Patient Active Problem List   Diagnosis Date Noted  . Essential (primary) hypertension 07/09/2018  . HLD (hyperlipidemia) 07/09/2018  . Status post cataract extraction and insertion of intraocular lens of right eye 07/09/2018  . Hyperglycemia 11/24/1967   Past Medical History:  Diagnosis Date  . Arthritis    right ankle, wears brace prn  . Hypertension   . Pain in head    occasional pain back of head on right for last couple of years, to see neurlogist for     Family History  Problem Relation Age  of Onset  . Stroke Mother   . Anuerysm Brother   . Heart failure Brother   . Cancer Father     Past Surgical History:  Procedure Laterality Date  . APPENDECTOMY  1980's   ruptured  . BREAST BIOPSY Left 1980  . CATARACT EXTRACTION    . COLONOSCOPY WITH PROPOFOL N/A 02/11/2014   Procedure: COLONOSCOPY WITH PROPOFOL;  Surgeon: Charolett Bumpers, MD;  Location: [facility];  Service: Endoscopy;  Laterality: N/A;  . EYE SURGERY    . TUBAL LIGATION  1979   Social History   Occupational History  . Not on file  Tobacco Use  . Smoking status: Current Every Day Smoker    Packs/day: 0.50    Years: 47.00    Pack years: 23.50    Types: Cigarettes  . Smokeless tobacco: Never Used  . Tobacco comment: patient is aware she needs to quit   Substance and Sexual Activity  . Alcohol use: No  . Drug use: No  . Sexual activity: Never

## 2018-08-08 DIAGNOSIS — M79661 Pain in right lower leg: Secondary | ICD-10-CM | POA: Diagnosis not present

## 2018-08-08 DIAGNOSIS — M5416 Radiculopathy, lumbar region: Secondary | ICD-10-CM | POA: Diagnosis not present

## 2018-08-15 DIAGNOSIS — M5416 Radiculopathy, lumbar region: Secondary | ICD-10-CM | POA: Diagnosis not present

## 2018-08-15 DIAGNOSIS — M79661 Pain in right lower leg: Secondary | ICD-10-CM | POA: Diagnosis not present

## 2018-10-01 ENCOUNTER — Ambulatory Visit: Payer: Self-pay

## 2018-10-01 ENCOUNTER — Encounter: Payer: Self-pay | Admitting: Acute Care

## 2018-10-08 DIAGNOSIS — E7800 Pure hypercholesterolemia, unspecified: Secondary | ICD-10-CM | POA: Diagnosis not present

## 2018-10-08 DIAGNOSIS — E559 Vitamin D deficiency, unspecified: Secondary | ICD-10-CM | POA: Diagnosis not present

## 2018-10-08 DIAGNOSIS — G4700 Insomnia, unspecified: Secondary | ICD-10-CM | POA: Diagnosis not present

## 2018-10-08 DIAGNOSIS — I10 Essential (primary) hypertension: Secondary | ICD-10-CM | POA: Diagnosis not present

## 2018-10-08 DIAGNOSIS — M85852 Other specified disorders of bone density and structure, left thigh: Secondary | ICD-10-CM | POA: Diagnosis not present

## 2018-11-28 ENCOUNTER — Other Ambulatory Visit (INDEPENDENT_AMBULATORY_CARE_PROVIDER_SITE_OTHER): Payer: Self-pay | Admitting: Family Medicine

## 2019-01-01 DIAGNOSIS — E7800 Pure hypercholesterolemia, unspecified: Secondary | ICD-10-CM | POA: Diagnosis not present

## 2019-01-01 DIAGNOSIS — R42 Dizziness and giddiness: Secondary | ICD-10-CM | POA: Diagnosis not present

## 2019-01-01 DIAGNOSIS — E559 Vitamin D deficiency, unspecified: Secondary | ICD-10-CM | POA: Diagnosis not present

## 2019-01-01 DIAGNOSIS — G4700 Insomnia, unspecified: Secondary | ICD-10-CM | POA: Diagnosis not present

## 2019-01-01 DIAGNOSIS — I10 Essential (primary) hypertension: Secondary | ICD-10-CM | POA: Diagnosis not present

## 2019-01-01 DIAGNOSIS — M85852 Other specified disorders of bone density and structure, left thigh: Secondary | ICD-10-CM | POA: Diagnosis not present

## 2019-01-14 ENCOUNTER — Encounter (HOSPITAL_COMMUNITY): Payer: Self-pay | Admitting: Emergency Medicine

## 2019-01-14 ENCOUNTER — Other Ambulatory Visit: Payer: Self-pay

## 2019-01-14 DIAGNOSIS — I10 Essential (primary) hypertension: Secondary | ICD-10-CM | POA: Diagnosis not present

## 2019-01-14 DIAGNOSIS — Z880 Allergy status to penicillin: Secondary | ICD-10-CM | POA: Diagnosis not present

## 2019-01-14 DIAGNOSIS — Z20828 Contact with and (suspected) exposure to other viral communicable diseases: Secondary | ICD-10-CM | POA: Diagnosis not present

## 2019-01-14 DIAGNOSIS — R42 Dizziness and giddiness: Secondary | ICD-10-CM | POA: Insufficient documentation

## 2019-01-14 DIAGNOSIS — J40 Bronchitis, not specified as acute or chronic: Secondary | ICD-10-CM | POA: Diagnosis not present

## 2019-01-14 DIAGNOSIS — H6122 Impacted cerumen, left ear: Secondary | ICD-10-CM | POA: Insufficient documentation

## 2019-01-14 DIAGNOSIS — R05 Cough: Secondary | ICD-10-CM | POA: Diagnosis not present

## 2019-01-14 DIAGNOSIS — R509 Fever, unspecified: Secondary | ICD-10-CM | POA: Diagnosis not present

## 2019-01-14 DIAGNOSIS — Z79899 Other long term (current) drug therapy: Secondary | ICD-10-CM | POA: Insufficient documentation

## 2019-01-14 LAB — BASIC METABOLIC PANEL
Anion gap: 12 (ref 5–15)
BUN: 15 mg/dL (ref 8–23)
CO2: 22 mmol/L (ref 22–32)
Calcium: 9.2 mg/dL (ref 8.9–10.3)
Chloride: 101 mmol/L (ref 98–111)
Creatinine, Ser: 0.84 mg/dL (ref 0.44–1.00)
GFR calc Af Amer: >60 mL/min (ref >60)
GFR calc non Af Amer: >60 mL/min (ref >60)
Glucose, Bld: 111 mg/dL — ABNORMAL HIGH (ref 70–99)
Potassium: 4.4 mmol/L (ref 3.5–5.1)
Sodium: 135 mmol/L (ref 135–145)

## 2019-01-14 LAB — URINALYSIS, ROUTINE W REFLEX MICROSCOPIC
Bacteria, UA: NONE SEEN
Bilirubin Urine: NEGATIVE
Glucose, UA: NEGATIVE mg/dL
Ketones, ur: NEGATIVE mg/dL
Leukocytes,Ua: NEGATIVE
Nitrite: NEGATIVE
Protein, ur: NEGATIVE mg/dL
Specific Gravity, Urine: 1.004 — ABNORMAL LOW (ref 1.005–1.030)
pH: 6 (ref 5.0–8.0)

## 2019-01-14 LAB — CBC
HCT: 41.3 % (ref 36.0–46.0)
Hemoglobin: 13.7 g/dL (ref 12.0–15.0)
MCH: 29.8 pg (ref 26.0–34.0)
MCHC: 33.2 g/dL (ref 30.0–36.0)
MCV: 89.8 fL (ref 80.0–100.0)
Platelets: 192 K/uL (ref 150–400)
RBC: 4.6 MIL/uL (ref 3.87–5.11)
RDW: 12.7 % (ref 11.5–15.5)
WBC: 8.3 K/uL (ref 4.0–10.5)
nRBC: 0 % (ref 0.0–0.2)

## 2019-01-14 LAB — CBG MONITORING, ED: Glucose-Capillary: 106 mg/dL — ABNORMAL HIGH (ref 70–99)

## 2019-01-14 MED ORDER — SODIUM CHLORIDE 0.9% FLUSH
3.0000 mL | Freq: Once | INTRAVENOUS | Status: AC
  Administered 2019-01-15: 04:00:00 3 mL via INTRAVENOUS

## 2019-01-14 NOTE — ED Triage Notes (Signed)
Pt reports having dizziness since having water get into left ear appx 2 weeks ago. Pt reporting increasing weakness since that time. Pt currently alert and oriented x 4 at this time.

## 2019-01-15 ENCOUNTER — Emergency Department (HOSPITAL_COMMUNITY)
Admission: EM | Admit: 2019-01-15 | Discharge: 2019-01-15 | Disposition: A | Discharge status: Home / Self Care | Payer: Medicare HMO | Attending: Emergency Medicine | Admitting: Emergency Medicine

## 2019-01-15 ENCOUNTER — Emergency Department (HOSPITAL_COMMUNITY): Payer: Medicare HMO

## 2019-01-15 DIAGNOSIS — H6122 Impacted cerumen, left ear: Secondary | ICD-10-CM

## 2019-01-15 DIAGNOSIS — J40 Bronchitis, not specified as acute or chronic: Secondary | ICD-10-CM

## 2019-01-15 DIAGNOSIS — R42 Dizziness and giddiness: Secondary | ICD-10-CM

## 2019-01-15 DIAGNOSIS — R05 Cough: Secondary | ICD-10-CM | POA: Diagnosis not present

## 2019-01-15 LAB — SARS CORONAVIRUS 2 BY RT PCR (HOSPITAL ORDER, PERFORMED IN ~~LOC~~ HOSPITAL LAB): SARS Coronavirus 2: NEGATIVE

## 2019-01-15 MED ORDER — MECLIZINE HCL 25 MG PO TABS: 25.0000 mg | ORAL_TABLET | Freq: Three times a day (TID) | ORAL | 0 refills | Status: AC | PRN

## 2019-01-15 MED ORDER — CARBAMIDE PEROXIDE 6.5 % OT SOLN
10.0000 [drp] | Freq: Once | OTIC | Status: AC
  Administered 2019-01-15: 01:00:00 10 [drp] via OTIC
  Filled 2019-01-15: qty 15

## 2019-01-15 MED ORDER — DOCUSATE SODIUM 50 MG/5ML PO LIQD
50.0000 mg | Freq: Once | ORAL | Status: AC
  Administered 2019-01-15: 04:00:00 50 mg via OTIC
  Filled 2019-01-15: qty 10

## 2019-01-15 MED ORDER — AZITHROMYCIN 250 MG PO TABS: ORAL_TABLET | ORAL | 0 refills | Status: DC

## 2019-01-15 NOTE — ED Notes (Signed)
Hydrogen peroxide placed in left ear per Tomi Bamberger MD

## 2019-01-15 NOTE — ED Provider Notes (Signed)
[facility]-EMERGENCY DEPT Provider Note   CSN: [id number] Arrival date & time: 01/14/19  2127  Time seen 12:30 AM  History   Chief Complaint Chief Complaint  Patient presents with   Dizziness    HPI Jaclyn Golden is a 68 y.o. female.     HPI patient states she has had a cough for the past 3 weeks.  She states she produces some clear mucus and now her cough is dry.  About 2 weeks ago she was shampooing her hair and she got shampoo and water in her left ear.  She states since then she feels like the room is spinning especially if she lays flat, bends over, moves her head quickly, or looks to her right.  She called her primary care doctor and was placed on meclizine which she only took for a week.  She states it made her feel extremely fatigued.  She states she also cannot sleep.  Patient reports her husband died in October and she does not admit to being depressed and does not want to see a Veterinary surgeoncounselor.  She states she has been taking Mucinex over-the-counter.  Tonight about 10 PM she took her temperature and it was of 100.4 and she took a ibuprofen.  She denies chest pain, shortness of breath, sore throat, nausea, vomiting, or diarrhea.  She states if she bends over she gets clear rhinorrhea.  She also reports ringing in her ears and thinks it is in both ears but the left is the worse since she started feeling dizzy 2 weeks ago.  She denies any headache.  PCP Dr Azucena CecilSwayne   Past Medical History:  Diagnosis Date   Arthritis    right ankle, wears brace prn   Hypertension    Pain in head    occasional pain back of head on right for last couple of years, to see neurlogist for     Patient Active Problem List   Diagnosis Date Noted   Essential (primary) hypertension 07/09/2018   HLD (hyperlipidemia) 07/09/2018   Status post cataract extraction and insertion of intraocular lens of right eye 07/09/2018   Hyperglycemia 11/24/1967    Past Surgical History:    Procedure Laterality Date   APPENDECTOMY  1980's   ruptured   BREAST BIOPSY Left 1980   CATARACT EXTRACTION     COLONOSCOPY WITH PROPOFOL N/A 02/11/2014   Procedure: COLONOSCOPY WITH PROPOFOL;  Surgeon: Charolett BumpersMartin K Johnson, MD;  Location: [facility];  Service: Endoscopy;  Laterality: N/A;   EYE SURGERY     TUBAL LIGATION  1979     OB History   No obstetric history on file.      Home Medications    Prior to Admission medications   Medication Sig Start Date End Date Taking? Authorizing Provider  calcium-vitamin D 250-100 MG-UNIT tablet Take 1 tablet by mouth 2 (two) times daily.   Yes [provider]  Cholecalciferol (VITAMIN D3 PO) Take 1 tablet by mouth daily.    Yes [provider]  etodolac (LODINE) 400 MG tablet Take 1 tablet (400 mg total) by mouth 2 (two) times daily as needed. Patient taking differently: Take 400 mg by mouth 2 (two) times daily as needed for mild pain or moderate pain.  08/01/18  Yes Hilts, Casimiro NeedleMichael, MD  ibuprofen (ADVIL,MOTRIN) 200 MG tablet Take 200 mg by mouth every 6 (six) hours as needed for fever, headache, moderate pain or cramping.    Yes [provider]  Multiple Vitamins-Minerals (  MULTIVITAMIN WITH MINERALS) tablet Take 1 tablet by mouth daily.   Yes [provider]  Multiple Vitamins-Minerals (PRESERVISION AREDS 2+MULTI VIT PO) Take 1 tablet by mouth daily.    Yes [provider]  rosuvastatin (CRESTOR) 10 MG tablet Take 10 mg by mouth every other day.  07/13/18  Yes [provider]  TURMERIC PO Take 1 tablet by mouth daily.    Yes [provider]  azithromycin (ZITHROMAX Z-PAK) 250 MG tablet Take 2 po the first day then once a day for the next 4 days. 01/15/19   Rolland Porter, MD  meclizine (ANTIVERT) 25 MG tablet Take 1 tablet (25 mg total) by mouth 3 (three) times daily as needed for dizziness. 01/15/19   Rolland Porter, MD    Family History Family History  Problem Relation Age of Onset    Stroke Mother    Anuerysm Brother    Heart failure Brother    Cancer Father     Social History Social History   Tobacco Use   Smoking status: Current Every Day Smoker    Packs/day: 0.50    Years: 47.00    Pack years: 23.50    Types: Cigarettes   Smokeless tobacco: Never Used   Tobacco comment: patient is aware she needs to quit   Substance Use Topics   Alcohol use: No   Drug use: No  Patient states her daughter and son-in-law have moved in with her, she states her son-in-law is a Print production planner and has been working the whole time during the [place] crisis.   Allergies   Penicillins   Review of Systems Review of Systems  All other systems reviewed and are negative.    Physical Exam Updated Vital Signs BP 126/76    Pulse 68    Temp 98.8 F (37.1 C) (Oral) Comment: 200mg  ibprofen at 21:00   Resp (!) 22    Ht 5\' 2"  (1.575 m)    Wt 71.7 kg    SpO2 90%    BMI 28.90 kg/m   Physical Exam Vitals signs and nursing note reviewed.  Constitutional:      Appearance: Normal appearance. She is normal weight.  HENT:     Head: Normocephalic and atraumatic.     Right Ear: Tympanic membrane, ear canal and external ear normal.     Left Ear: External ear normal. There is impacted cerumen.     Nose: Nose normal.     Mouth/Throat:     Mouth: Mucous membranes are moist.  Eyes:     Extraocular Movements: Extraocular movements intact.     Conjunctiva/sclera: Conjunctivae normal.     Pupils: Pupils are equal, round, and reactive to light.  Neck:     Musculoskeletal: Normal range of motion.  Cardiovascular:     Rate and Rhythm: Normal rate and regular rhythm.     Heart sounds: Normal heart sounds.  Pulmonary:     Effort: Pulmonary effort is normal. No respiratory distress.     Breath sounds: Normal breath sounds.  Musculoskeletal: Normal range of motion.  Skin:    General: Skin is warm and dry.     Findings: No erythema.  Neurological:     General: No focal deficit  present.     Mental Status: She is alert and oriented to person, place, and time.     Cranial Nerves: No cranial nerve deficit.  Psychiatric:        Mood and Affect: Mood normal.  Behavior: Behavior normal.        Thought Content: Thought content normal.      ED Treatments / Results  Labs (all labs ordered are listed, but only abnormal results are displayed) Labs Reviewed  BASIC METABOLIC PANEL - Abnormal; Notable for the following components:      Result Value   Glucose, Bld 111 (*)    All other components within normal limits  URINALYSIS, ROUTINE W REFLEX MICROSCOPIC - Abnormal; Notable for the following components:   Color, Urine STRAW (*)    Specific Gravity, Urine 1.004 (*)    Hgb urine dipstick SMALL (*)    All other components within normal limits  CBG MONITORING, ED - Abnormal; Notable for the following components:   Glucose-Capillary 106 (*)    All other components within normal limits  SARS CORONAVIRUS 2 (HOSPITAL ORDER, PERFORMED IN [facility])  CBC    EKG EKG Interpretation  Date/Time:  Monday January 14 2019 23:03:21 EDT Ventricular Rate:  76 PR Interval:    QRS Duration: 97 QT Interval:  389 QTC Calculation: 438 R Axis:   5 Text Interpretation:  Sinus rhythm Low voltage, precordial leads Baseline wander in lead(s) II aVR No old tracing to compare Confirmed by Devoria AlbeKnapp, Vennela Jutte ([id number]) on 01/14/2019 11:36:17 PM Also confirmed by Devoria AlbeKnapp, Gates Jividen ([id number]), editor Elita QuickWatlington, Beverly ([id number])  on 01/15/2019 6:36:06 AM   Radiology Dg Chest Port 1 View  Result Date: 01/15/2019 CLINICAL DATA:  Cough EXAM: PORTABLE CHEST 1 VIEW COMPARISON:  06/09/2016 FINDINGS: The heart size and mediastinal contours are within normal limits. No acute consolidation or effusion. Punctate pulmonary nodules without significant change. Normal heart size. No pneumothorax. IMPRESSION: No active disease. Electronically Signed   By: Jasmine PangKim  Fujinaga M.D.   On: 01/15/2019 01:39     Procedures Procedures (including critical care time)  Medications Ordered in ED Medications  sodium chloride flush (NS) 0.9 % injection 3 mL (3 mLs Intravenous Given 01/15/19 0407)  carbamide peroxide (DEBROX) 6.5 % OTIC (EAR) solution 10 drop (10 drops Left EAR Given 01/15/19 0109)  docusate (COLACE) 50 MG/5ML liquid 50 mg (50 mg Left EAR Given 01/15/19 0414)     Initial Impression / Assessment and Plan / ED Course  I have reviewed the triage vital signs and the nursing notes.  Pertinent labs & imaging results that were available during my care of the patient were reviewed by me and considered in my medical decision making (see chart for details).   Chest x-ray was done to look for pneumonia, COVID testing was done.  Debrox was placed in her left ear for 20 minutes and then was irrigated by nursing staff.  4:00 AM when I recheck her left ear she still has the large ear cerumen impaction.  Colace and peroxide was placed in her ear and we will try to re-irrigate it.  Recheck at 6 AM nurse was irrigating and getting lots of particles out of her ear.  When I recheck her ear hurt cerumen impaction is now gone.  Her TM is intact.  Patient seems to be feeling better.  We discussed taking Mucinex DM over-the-counter for her cough, she was placed on a Z-Pak.  She wants to try to continue the meclizine for her dizziness.  She can take acetaminophen for her fever.  She was advised to drink more fluids and to let her doctor know if she is getting worse.  Final Clinical Impressions(s) / ED Diagnoses   Final diagnoses:  Vertigo  Impacted cerumen of left ear  Bronchitis    ED Discharge Orders         Ordered    azithromycin (ZITHROMAX Z-PAK) 250 MG tablet     01/15/19 0647    meclizine (ANTIVERT) 25 MG tablet  3 times daily PRN     01/15/19 0647          Plan discharge  Devoria AlbeIva Sheral Pfahler, MD, Concha PyoFACEP    Yaxiel Minnie, MD 01/15/19 [phone]

## 2019-01-15 NOTE — Discharge Instructions (Addendum)
Drink plenty of fluids.  Take Mucinex DM over-the-counter for your cough.  Take the antibiotics until gone.  Try the Antivert for your dizziness, if it makes you too sleepy you can try Dramamine over-the-counter.  You can take acetaminophen 650 mg 4 times a day as needed for fever.  Let your doctor know if you get short of breath, have uncontrolled vomiting, or you feel worse.

## 2019-02-04 DIAGNOSIS — M791 Myalgia, unspecified site: Secondary | ICD-10-CM | POA: Diagnosis not present

## 2019-02-04 DIAGNOSIS — R42 Dizziness and giddiness: Secondary | ICD-10-CM | POA: Diagnosis not present

## 2019-02-04 DIAGNOSIS — E559 Vitamin D deficiency, unspecified: Secondary | ICD-10-CM | POA: Diagnosis not present

## 2019-02-04 DIAGNOSIS — E7800 Pure hypercholesterolemia, unspecified: Secondary | ICD-10-CM | POA: Diagnosis not present

## 2019-02-04 DIAGNOSIS — G4700 Insomnia, unspecified: Secondary | ICD-10-CM | POA: Diagnosis not present

## 2019-02-04 DIAGNOSIS — I10 Essential (primary) hypertension: Secondary | ICD-10-CM | POA: Diagnosis not present

## 2019-02-04 DIAGNOSIS — M85852 Other specified disorders of bone density and structure, left thigh: Secondary | ICD-10-CM | POA: Diagnosis not present

## 2019-02-13 ENCOUNTER — Telehealth: Payer: Self-pay | Admitting: Acute Care

## 2019-02-15 NOTE — Telephone Encounter (Signed)
Spoke with Jaclyn Golden at [facility] and let her know that we have tried multiple times to schedule pt and she has not returned our calls yet.  We will continue to try. Nothing further needed at this time.

## 2019-02-15 NOTE — Telephone Encounter (Signed)
LMTC x 1  

## 2019-02-20 DIAGNOSIS — M791 Myalgia, unspecified site: Secondary | ICD-10-CM | POA: Diagnosis not present

## 2019-02-20 DIAGNOSIS — E7800 Pure hypercholesterolemia, unspecified: Secondary | ICD-10-CM | POA: Diagnosis not present

## 2019-02-20 DIAGNOSIS — E559 Vitamin D deficiency, unspecified: Secondary | ICD-10-CM | POA: Diagnosis not present

## 2019-07-04 DIAGNOSIS — R05 Cough: Secondary | ICD-10-CM | POA: Diagnosis not present

## 2019-07-04 DIAGNOSIS — J22 Unspecified acute lower respiratory infection: Secondary | ICD-10-CM | POA: Diagnosis not present

## 2019-09-06 DIAGNOSIS — E7800 Pure hypercholesterolemia, unspecified: Secondary | ICD-10-CM | POA: Diagnosis not present

## 2019-09-06 DIAGNOSIS — I10 Essential (primary) hypertension: Secondary | ICD-10-CM | POA: Diagnosis not present

## 2019-09-06 DIAGNOSIS — R05 Cough: Secondary | ICD-10-CM | POA: Diagnosis not present

## 2019-09-06 DIAGNOSIS — R062 Wheezing: Secondary | ICD-10-CM | POA: Diagnosis not present

## 2019-09-06 DIAGNOSIS — Z1152 Encounter for screening for COVID-19: Secondary | ICD-10-CM | POA: Diagnosis not present

## 2019-09-19 DIAGNOSIS — R42 Dizziness and giddiness: Secondary | ICD-10-CM | POA: Diagnosis not present

## 2019-09-19 DIAGNOSIS — F4321 Adjustment disorder with depressed mood: Secondary | ICD-10-CM | POA: Diagnosis not present

## 2019-09-19 DIAGNOSIS — H6120 Impacted cerumen, unspecified ear: Secondary | ICD-10-CM | POA: Diagnosis not present

## 2019-09-19 DIAGNOSIS — I10 Essential (primary) hypertension: Secondary | ICD-10-CM | POA: Diagnosis not present

## 2019-10-21 ENCOUNTER — Ambulatory Visit
Admission: RE | Admit: 2019-10-21 | Discharge: 2019-10-21 | Disposition: A | Discharge status: Home / Self Care | Payer: Medicare HMO | Source: Ambulatory Visit | Attending: Family Medicine | Admitting: Family Medicine

## 2019-10-21 ENCOUNTER — Other Ambulatory Visit: Payer: Self-pay | Admitting: Family Medicine

## 2019-10-21 DIAGNOSIS — R05 Cough: Secondary | ICD-10-CM | POA: Diagnosis not present

## 2019-10-21 DIAGNOSIS — J209 Acute bronchitis, unspecified: Secondary | ICD-10-CM

## 2019-10-21 DIAGNOSIS — R0602 Shortness of breath: Secondary | ICD-10-CM | POA: Diagnosis not present

## 2019-10-21 DIAGNOSIS — M85852 Other specified disorders of bone density and structure, left thigh: Secondary | ICD-10-CM | POA: Diagnosis not present

## 2019-10-21 DIAGNOSIS — E559 Vitamin D deficiency, unspecified: Secondary | ICD-10-CM | POA: Diagnosis not present

## 2019-10-21 DIAGNOSIS — H6120 Impacted cerumen, unspecified ear: Secondary | ICD-10-CM | POA: Diagnosis not present

## 2019-10-21 DIAGNOSIS — I10 Essential (primary) hypertension: Secondary | ICD-10-CM | POA: Diagnosis not present

## 2019-10-21 DIAGNOSIS — G4700 Insomnia, unspecified: Secondary | ICD-10-CM | POA: Diagnosis not present

## 2019-10-21 DIAGNOSIS — E7800 Pure hypercholesterolemia, unspecified: Secondary | ICD-10-CM | POA: Diagnosis not present

## 2019-10-29 ENCOUNTER — Encounter (HOSPITAL_COMMUNITY): Payer: Self-pay | Admitting: Emergency Medicine

## 2019-10-29 ENCOUNTER — Emergency Department (HOSPITAL_COMMUNITY): Payer: Medicare HMO

## 2019-10-29 ENCOUNTER — Observation Stay (HOSPITAL_BASED_OUTPATIENT_CLINIC_OR_DEPARTMENT_OTHER): Payer: Medicare HMO

## 2019-10-29 ENCOUNTER — Observation Stay (HOSPITAL_COMMUNITY): Payer: Medicare HMO

## 2019-10-29 ENCOUNTER — Other Ambulatory Visit: Payer: Self-pay

## 2019-10-29 ENCOUNTER — Observation Stay (HOSPITAL_COMMUNITY)
Admission: EM | Admit: 2019-10-29 | Discharge: 2019-10-30 | Disposition: A | Discharge status: Home / Self Care | Payer: Medicare HMO | Attending: Internal Medicine | Admitting: Internal Medicine

## 2019-10-29 DIAGNOSIS — D72829 Elevated white blood cell count, unspecified: Secondary | ICD-10-CM | POA: Insufficient documentation

## 2019-10-29 DIAGNOSIS — R55 Syncope and collapse: Principal | ICD-10-CM | POA: Diagnosis present

## 2019-10-29 DIAGNOSIS — I10 Essential (primary) hypertension: Secondary | ICD-10-CM | POA: Diagnosis not present

## 2019-10-29 DIAGNOSIS — F17210 Nicotine dependence, cigarettes, uncomplicated: Secondary | ICD-10-CM | POA: Diagnosis not present

## 2019-10-29 DIAGNOSIS — E785 Hyperlipidemia, unspecified: Secondary | ICD-10-CM | POA: Diagnosis not present

## 2019-10-29 DIAGNOSIS — Z20822 Contact with and (suspected) exposure to covid-19: Secondary | ICD-10-CM | POA: Diagnosis not present

## 2019-10-29 DIAGNOSIS — Z209 Contact with and (suspected) exposure to unspecified communicable disease: Secondary | ICD-10-CM | POA: Diagnosis not present

## 2019-10-29 DIAGNOSIS — R2681 Unsteadiness on feet: Secondary | ICD-10-CM | POA: Diagnosis not present

## 2019-10-29 DIAGNOSIS — Z79899 Other long term (current) drug therapy: Secondary | ICD-10-CM | POA: Insufficient documentation

## 2019-10-29 DIAGNOSIS — R05 Cough: Secondary | ICD-10-CM | POA: Diagnosis not present

## 2019-10-29 DIAGNOSIS — R29810 Facial weakness: Secondary | ICD-10-CM | POA: Diagnosis not present

## 2019-10-29 DIAGNOSIS — R519 Headache, unspecified: Secondary | ICD-10-CM | POA: Diagnosis not present

## 2019-10-29 DIAGNOSIS — R42 Dizziness and giddiness: Secondary | ICD-10-CM | POA: Diagnosis not present

## 2019-10-29 DIAGNOSIS — F17200 Nicotine dependence, unspecified, uncomplicated: Secondary | ICD-10-CM | POA: Diagnosis present

## 2019-10-29 LAB — MAGNESIUM: Magnesium: 2.1 mg/dL (ref 1.7–2.4)

## 2019-10-29 LAB — URINALYSIS, ROUTINE W REFLEX MICROSCOPIC
Bilirubin Urine: NEGATIVE
Glucose, UA: NEGATIVE mg/dL
Hgb urine dipstick: NEGATIVE
Ketones, ur: NEGATIVE mg/dL
Leukocytes,Ua: NEGATIVE
Nitrite: NEGATIVE
Protein, ur: NEGATIVE mg/dL
Specific Gravity, Urine: 1.008 (ref 1.005–1.030)
pH: 6 (ref 5.0–8.0)

## 2019-10-29 LAB — COMPREHENSIVE METABOLIC PANEL
ALT: 19 U/L (ref 0–44)
AST: 27 U/L (ref 15–41)
Albumin: 4.1 g/dL (ref 3.5–5.0)
Alkaline Phosphatase: 74 U/L (ref 38–126)
Anion gap: 11 (ref 5–15)
BUN: 21 mg/dL (ref 8–23)
CO2: 26 mmol/L (ref 22–32)
Calcium: 9 mg/dL (ref 8.9–10.3)
Chloride: 98 mmol/L (ref 98–111)
Creatinine, Ser: 0.83 mg/dL (ref 0.44–1.00)
GFR calc Af Amer: >60 mL/min (ref >60)
GFR calc non Af Amer: >60 mL/min (ref >60)
Glucose, Bld: 104 mg/dL — ABNORMAL HIGH (ref 70–99)
Potassium: 4.7 mmol/L (ref 3.5–5.1)
Sodium: 135 mmol/L (ref 135–145)
Total Bilirubin: 0.7 mg/dL (ref 0.3–1.2)
Total Protein: 6.9 g/dL (ref 6.5–8.1)

## 2019-10-29 LAB — CBC WITH DIFFERENTIAL/PLATELET
Abs Immature Granulocytes: 0.06 10*3/uL (ref 0.00–0.07)
Basophils Absolute: 0 10*3/uL (ref 0.0–0.1)
Basophils Relative: 0 %
Eosinophils Absolute: 0.2 10*3/uL (ref 0.0–0.5)
Eosinophils Relative: 1 %
HCT: 45.8 % (ref 36.0–46.0)
Hemoglobin: 14.9 g/dL (ref 12.0–15.0)
Immature Granulocytes: 1 %
Lymphocytes Relative: 14 %
Lymphs Abs: 1.9 10*3/uL (ref 0.7–4.0)
MCH: 29.4 pg (ref 26.0–34.0)
MCHC: 32.5 g/dL (ref 30.0–36.0)
MCV: 90.3 fL (ref 80.0–100.0)
Monocytes Absolute: 0.7 10*3/uL (ref 0.1–1.0)
Monocytes Relative: 5 %
Neutro Abs: 10.2 10*3/uL — ABNORMAL HIGH (ref 1.7–7.7)
Neutrophils Relative %: 79 %
Platelets: 217 10*3/uL (ref 150–400)
RBC: 5.07 MIL/uL (ref 3.87–5.11)
RDW: 13.5 % (ref 11.5–15.5)
WBC: 12.9 10*3/uL — ABNORMAL HIGH (ref 4.0–10.5)
nRBC: 0 % (ref 0.0–0.2)

## 2019-10-29 LAB — SARS CORONAVIRUS 2 BY RT PCR (HOSPITAL ORDER, PERFORMED IN ~~LOC~~ HOSPITAL LAB): SARS Coronavirus 2: NEGATIVE

## 2019-10-29 LAB — TROPONIN I (HIGH SENSITIVITY): Troponin I (High Sensitivity): 3 ng/L (ref <18)

## 2019-10-29 MED ORDER — ACETAMINOPHEN 650 MG RE SUPP: 650.0000 mg | Freq: Four times a day (QID) | RECTAL | Status: DC | PRN

## 2019-10-29 MED ORDER — ROSUVASTATIN CALCIUM 10 MG PO TABS: 10.0000 mg | ORAL_TABLET | ORAL | Status: DC

## 2019-10-29 MED ORDER — ACETAMINOPHEN 325 MG PO TABS: 650.0000 mg | ORAL_TABLET | Freq: Four times a day (QID) | ORAL | Status: DC | PRN

## 2019-10-29 MED ORDER — ONDANSETRON HCL 4 MG PO TABS: 4.0000 mg | ORAL_TABLET | Freq: Four times a day (QID) | ORAL | Status: DC | PRN

## 2019-10-29 MED ORDER — MECLIZINE HCL 25 MG PO TABS
25.0000 mg | ORAL_TABLET | Freq: Three times a day (TID) | ORAL | Status: DC | PRN
  Administered 2019-10-29: 25 mg via ORAL
  Filled 2019-10-29: qty 1

## 2019-10-29 MED ORDER — ONDANSETRON HCL 4 MG/2ML IJ SOLN: 4.0000 mg | Freq: Four times a day (QID) | INTRAMUSCULAR | Status: DC | PRN

## 2019-10-29 MED ORDER — AMLODIPINE BESYLATE 5 MG PO TABS
5.0000 mg | ORAL_TABLET | Freq: Every day | ORAL | Status: DC
  Administered 2019-10-29: 5 mg via ORAL
  Filled 2019-10-29: qty 1

## 2019-10-29 MED ORDER — SODIUM CHLORIDE 0.9 % IV SOLN: INTRAVENOUS | Status: DC

## 2019-10-29 MED ORDER — ENOXAPARIN SODIUM 40 MG/0.4ML ~~LOC~~ SOLN
40.0000 mg | SUBCUTANEOUS | Status: DC
  Filled 2019-10-29: qty 0.4

## 2019-10-29 NOTE — Progress Notes (Signed)
  Echocardiogram 2D Echocardiogram has been performed.  Tye Savoy 10/29/2019, 3:59 PM

## 2019-10-29 NOTE — ED Notes (Signed)
Pt to CT

## 2019-10-29 NOTE — H&P (Signed)
History and Physical    Jaclyn Golden ENI:[id number] DOB: 1951-02-26 DOA: 10/29/2019  PCP: Orland Penman, MD  Patient coming from: Home  I have personally briefly reviewed patient's old medical records in [facility]  Chief Complaint: Passed out  HPI: Jaclyn Golden is a 69 y.o. female with medical history significant of hypertension, hyperlipidemia and tobacco dependency was brought into the emergency department due to episode of passing out.  Daughter at the bedside.  According to the daughter and the patient, patient was working in her kitchen and started feeling dizzy.  She usually has intermittent dizziness but this time the dizziness was slightly different than her usual.  She sat down in the chair and then soon after that, she lost her consciousness as well as postural tone for about 10 minutes.  EMS was called.  Patient was confused after she regained her consciousness.  The daughter did not notice any tremors, jerking movements, bowel or bladder incontinence or any tongue biting.  Patient was subsequently brought to the emergency department.  No previous episode of as such.  No history of CAD, MI, any arrhythmia, stroke, any recent travel or any sick contact.  Patient has dry cough and she was diagnosed with acute bronchitis 10 days ago for which she completed azithromycin and she is currently on tapering dose of steroids.  She completed her 2 doses of COVID-19 Pfizer vaccine with the last being on 15 April.  ED Course: Upon arrival to ED, she was slightly hypertensive.  Otherwise stable.  Mild leukocytosis of 12.9.  Troponin negative.  Hospital service was consulted for further management.  Review of Systems: As per HPI otherwise negative.    Past Medical History:  Diagnosis Date  . Arthritis    right ankle, wears brace prn  . Hypertension   . Pain in head    occasional pain back of head on right for last couple of years, to see neurlogist for     Past  Surgical History:  Procedure Laterality Date  . APPENDECTOMY  1980's   ruptured  . BREAST BIOPSY Left 1980  . CATARACT EXTRACTION    . COLONOSCOPY WITH PROPOFOL N/A 02/11/2014   Procedure: COLONOSCOPY WITH PROPOFOL;  Surgeon: Charolett Bumpers, MD;  Location: [facility];  Service: Endoscopy;  Laterality: N/A;  . EYE SURGERY    . TUBAL LIGATION  1979     reports that she has been smoking cigarettes. She has a 23.50 pack-year smoking history. She has never used smokeless tobacco. She reports that she does not drink alcohol or use drugs.  Allergies  Allergen Reactions  . Penicillins Other (See Comments)    Pass out   Has patient had a PCN reaction causing immediate rash, facial/tongue/throat swelling, SOB or lightheadedness with hypotension:YES Has patient had a PCN reaction causing severe rash involving mucus membranes or skin necrosis:No Has patient had a PCN reaction that required hospitalization:NO Has patient had a PCN reaction occurring within the last 10 years:NO If all of the above answers are "NO", then may proceed with Cephalosporin use.     Family History  Problem Relation Age of Onset  . Stroke Mother   . Anuerysm Brother   . Heart failure Brother   . Cancer Father     Prior to Admission medications   Medication Sig Start Date End Date Taking? Authorizing Provider  amLODipine (NORVASC) 5 MG tablet Take 5 mg by mouth daily. 09/19/19   [provider]  azithromycin (  ZITHROMAX Z-PAK) 250 MG tablet Take 2 po the first day then once a day for the next 4 days. 01/15/19   Devoria Albe, MD  calcium-vitamin D 250-100 MG-UNIT tablet Take 1 tablet by mouth 2 (two) times daily.    [provider]  Cholecalciferol (VITAMIN D3 PO) Take 1 tablet by mouth daily.     [provider]  etodolac (LODINE) 400 MG tablet Take 1 tablet (400 mg total) by mouth 2 (two) times daily as needed. Patient taking differently: Take 400 mg by mouth 2 (two) times daily as needed  for mild pain or moderate pain.  08/01/18   Hilts, Casimiro Needle, MD  ibuprofen (ADVIL,MOTRIN) 200 MG tablet Take 200 mg by mouth every 6 (six) hours as needed for fever, headache, moderate pain or cramping.     [provider]  meclizine (ANTIVERT) 25 MG tablet Take 1 tablet (25 mg total) by mouth 3 (three) times daily as needed for dizziness. 01/15/19   Devoria Albe, MD  Multiple Vitamins-Minerals (MULTIVITAMIN WITH MINERALS) tablet Take 1 tablet by mouth daily.    [provider]  Multiple Vitamins-Minerals (PRESERVISION AREDS 2+MULTI VIT PO) Take 1 tablet by mouth daily.     [provider]  predniSONE (DELTASONE) 20 MG tablet Take 20 mg by mouth 2 (two) times daily. 9 day supply 10/21/19   [provider]  rosuvastatin (CRESTOR) 10 MG tablet Take 10 mg by mouth every other day.  07/13/18   [provider]  rosuvastatin (CRESTOR) 5 MG tablet Take 5 mg by mouth daily. 09/06/19   [provider]  TURMERIC PO Take 1 tablet by mouth daily.     [provider]    Physical Exam: Vitals:   10/29/19 1146 10/29/19 1147 10/29/19 1411 10/29/19 1446  BP: (!) 182/67  137/71 123/68  Pulse: 68  (!) 57 (!) 58  Resp: 18  16 18   Temp: 97.8 F (36.6 C)   98.2 F (36.8 C)  TempSrc: Oral   Oral  SpO2: 94%  93% 95%  Weight:  71 kg    Height:  5\' 2"  (1.575 m)      Constitutional: NAD, calm, comfortable Vitals:   10/29/19 1146 10/29/19 1147 10/29/19 1411 10/29/19 1446  BP: (!) 182/67  137/71 123/68  Pulse: 68  (!) 57 (!) 58  Resp: 18  16 18   Temp: 97.8 F (36.6 C)   98.2 F (36.8 C)  TempSrc: Oral   Oral  SpO2: 94%  93% 95%  Weight:  71 kg    Height:  5\' 2"  (1.575 m)     Eyes: PERRL, lids and conjunctivae normal ENMT: Mucous membranes are moist. Posterior pharynx clear of any exudate or lesions.Normal dentition.  Neck: normal, supple, no masses, no thyromegaly Respiratory: clear to auscultation bilaterally, no wheezing, no crackles. Normal  respiratory effort. No accessory muscle use.  Cardiovascular: Regular rate and rhythm, no murmurs / rubs / gallops. No extremity edema. 2+ pedal pulses. No carotid bruits.  Abdomen: no tenderness, no masses palpated. No hepatosplenomegaly. Bowel sounds positive.  Musculoskeletal: no clubbing / cyanosis. No joint deformity upper and lower extremities. Good ROM, no contractures. Normal muscle tone.  Skin: no rashes, lesions, ulcers. No induration Neurologic: CN 2-12 grossly intact. Sensation intact, DTR normal. Strength 5/5 in all 4.  Psychiatric: Normal judgment and insight. Alert and oriented x 3. Normal mood.    Labs on Admission: I have personally reviewed following labs and imaging studies  CBC: Recent  Labs  Lab 10/29/19 1227  WBC 12.9*  NEUTROABS 10.2*  HGB 14.9  HCT 45.8  MCV 90.3  PLT 217   Basic Metabolic Panel: Recent Labs  Lab 10/29/19 1227  NA 135  K 4.7  CL 98  CO2 26  GLUCOSE 104*  BUN 21  CREATININE 0.83  CALCIUM 9.0   GFR: Estimated Creatinine Clearance: 57.4 mL/min (by C-G formula based on SCr of 0.83 mg/dL). Liver Function Tests: Recent Labs  Lab 10/29/19 1227  AST 27  ALT 19  ALKPHOS 74  BILITOT 0.7  PROT 6.9  ALBUMIN 4.1   No results for input(s): LIPASE, AMYLASE in the last 168 hours. No results for input(s): AMMONIA in the last 168 hours. Coagulation Profile: No results for input(s): INR, PROTIME in the last 168 hours. Cardiac Enzymes: No results for input(s): CKTOTAL, CKMB, CKMBINDEX, TROPONINI in the last 168 hours. BNP (last 3 results) No results for input(s): PROBNP in the last 8760 hours. HbA1C: No results for input(s): HGBA1C in the last 72 hours. CBG: No results for input(s): GLUCAP in the last 168 hours. Lipid Profile: No results for input(s): CHOL, HDL, LDLCALC, TRIG, CHOLHDL, LDLDIRECT in the last 72 hours. Thyroid Function Tests: No results for input(s): TSH, T4TOTAL, FREET4, T3FREE, THYROIDAB in the last 72 hours. Anemia  Panel: No results for input(s): VITAMINB12, FOLATE, FERRITIN, TIBC, IRON, RETICCTPCT in the last 72 hours. Urine analysis:    Component Value Date/Time   COLORURINE YELLOW 10/29/2019 1416   APPEARANCEUR CLEAR 10/29/2019 1416   LABSPEC 1.008 10/29/2019 1416   PHURINE 6.0 10/29/2019 1416   GLUCOSEU NEGATIVE 10/29/2019 1416   HGBUR NEGATIVE 10/29/2019 1416   BILIRUBINUR NEGATIVE 10/29/2019 1416   KETONESUR NEGATIVE 10/29/2019 1416   PROTEINUR NEGATIVE 10/29/2019 1416   NITRITE NEGATIVE 10/29/2019 1416   LEUKOCYTESUR NEGATIVE 10/29/2019 1416    Radiological Exams on Admission: DG Chest 2 View  Result Date: 10/29/2019 CLINICAL DATA:  Cough, congestion, syncope EXAM: CHEST - 2 VIEW COMPARISON:  10/21/2019 FINDINGS: Heart and mediastinal contours are within normal limits. No focal opacities or effusions. No acute bony abnormality. IMPRESSION: No active cardiopulmonary disease. Electronically Signed   By: Charlett Nose M.D.   On: 10/29/2019 14:00    EKG: Independently reviewed.  Sinus bradycardia at 59 bpm.  No ST-T wave changes.  Assessment/Plan Active Problems:   Essential (primary) hypertension   HLD (hyperlipidemia)   Syncope   Tobacco dependence    Syncope: Likely cardiogenic syncope.  No focal neurological deficit.  No head imaging obtained.  Since the duration of syncope was very long which is 10 minutes, I will proceed with CT of the head.  We will monitor her on telemetry, obtain transthoracic echo as well as check orthostatic vitals every shift.  Provide with IV fluids normal saline at 100 cc/h.  Due to the history of loss of postural tone, I will also obtain EEG.  Essential hypertension: Initially elevated but now controlled.  Resume home dose of amlodipine and monitor.  Hyperlipidemia: Resume home statin.  Leukocytosis: Nonspecific and likely reactive.  No signs of infection.  Repeat CBC in the morning.  DVT prophylaxis: Lovenox Code Status: Full code Family  Communication: Daughter present at bedside.  Plan of care discussed with patient and daughter in length and they verbalized understanding and agreed with it. Disposition Plan: Likely discharge home tomorrow Consults called: None Admission status: Observation   Status is: Observation  The patient remains OBS appropriate and will d/c before 2 midnights.  Dispo: The patient is from: Home              Anticipated d/c is to: Home              Anticipated d/c date is: 1 day              Patient currently is not medically stable to d/c.  Darliss Cheney MD [facility]  10/29/2019, 2:59 PM  To contact the attending provider between 7A-7P or the covering provider during after hours 7P-7A, please log into the web site [email or web address]

## 2019-10-29 NOTE — ED Notes (Signed)
ECHO tech @bedside

## 2019-10-29 NOTE — ED Provider Notes (Signed)
[facility]-EMERGENCY DEPT Provider Note   CSN: [id number] Arrival date & time: 10/29/19  1133     History Chief Complaint  Patient presents with  . Loss of Consciousness    Jaclyn Golden is a 69 y.o. female with past medical history of hypertension, hyperglycemia, hyperlipidemia, who presents today for evaluation of syncopal event.  History obtained from patient, chart review, and her daughter.  Patient reports that she was standing in the kitchen and felt dizzy.  That is consistent with her usual vertigo.  She then got hot sweaty and had a syncopal event.  According to her daughter this involved full loss of tone and lasted for about 10 minutes.  She did not fall.  She has not had any medication changes recently.  She had not eaten breakfast when this happened however the syncopal episode happened when she was sitting.  She denies any pain in her head, chest, neck.  No recent chiropractic adjustments.  She denies any fevers.  She has been being treated for bronchitis  with azithromycin.    She reports that her shortness of breath is baseline.  No new pain.   She did have a brief episode of right sided headache posteriorly.  This is not new.  This happened after she had the syncopal event.  She did not have headache during the event.    HPI     Past Medical History:  Diagnosis Date  . Arthritis    right ankle, wears brace prn  . Hypertension   . Pain in head    occasional pain back of head on right for last couple of years, to see neurlogist for     Patient Active Problem List   Diagnosis Date Noted  . Syncope 10/29/2019  . Tobacco dependence 10/29/2019  . Essential (primary) hypertension 07/09/2018  . HLD (hyperlipidemia) 07/09/2018  . Status post cataract extraction and insertion of intraocular lens of right eye 07/09/2018  . Hyperglycemia 11/24/1967    Past Surgical History:  Procedure Laterality Date  . APPENDECTOMY  1980's   ruptured  .  BREAST BIOPSY Left 1980  . CATARACT EXTRACTION    . COLONOSCOPY WITH PROPOFOL N/A 02/11/2014   Procedure: COLONOSCOPY WITH PROPOFOL;  Surgeon: Charolett Bumpers, MD;  Location: [facility];  Service: Endoscopy;  Laterality: N/A;  . EYE SURGERY    . TUBAL LIGATION  1979     OB History   No obstetric history on file.     Family History  Problem Relation Age of Onset  . Stroke Mother   . Anuerysm Brother   . Heart failure Brother   . Cancer Father     Social History   Tobacco Use  . Smoking status: Current Every Day Smoker    Packs/day: 0.50    Years: 47.00    Pack years: 23.50    Types: Cigarettes  . Smokeless tobacco: Never Used  . Tobacco comment: patient is aware she needs to quit   Substance Use Topics  . Alcohol use: No  . Drug use: No    Home Medications Prior to Admission medications   Medication Sig Start Date End Date Taking? Authorizing Provider  amLODipine (NORVASC) 5 MG tablet Take 5 mg by mouth every evening.  09/19/19  Yes [provider]  Cholecalciferol (VITAMIN D3 PO) Take 1 tablet by mouth daily.    Yes [provider]  etodolac (LODINE) 400 MG tablet Take 1 tablet (400 mg total) by mouth  2 (two) times daily as needed. Patient taking differently: Take 400 mg by mouth 2 (two) times daily as needed for mild pain or moderate pain.  08/01/18  Yes Hilts, Casimiro Needle, MD  HYDROMET 5-1.5 MG/5ML syrup Take 5 mLs by mouth every 6 (six) hours as needed for cough.  09/06/19  Yes [provider]  meclizine (ANTIVERT) 25 MG tablet Take 1 tablet (25 mg total) by mouth 3 (three) times daily as needed for dizziness. 01/15/19  Yes Devoria Albe, MD  Multiple Vitamins-Minerals (MULTIVITAMIN WITH MINERALS) tablet Take 1 tablet by mouth daily.   Yes [provider]  Multiple Vitamins-Minerals (OCUVITE ADULT 50+ PO) Take 1 tablet by mouth daily.   Yes [provider]  Omega-3 Fatty Acids (FISH OIL) 1000 MG CAPS Take 1 capsule by mouth daily.    Yes [provider]  predniSONE (DELTASONE) 20 MG tablet Take 20 mg by mouth 2 (two) times daily. 9 day supply 10/21/19  Yes [provider]  rosuvastatin (CRESTOR) 5 MG tablet Take 5 mg by mouth every other day.  09/06/19  Yes [provider]    Allergies    Penicillins  Review of Systems   Review of Systems  Constitutional: Positive for fatigue. Negative for chills and fever.  HENT: Negative for congestion.   Eyes: Negative for visual disturbance.  Respiratory: Negative for cough.   Cardiovascular: Negative for chest pain.  Gastrointestinal: Negative for abdominal pain.  Genitourinary: Negative for dysuria.  Musculoskeletal: Negative for back pain and myalgias.  Skin: Negative for color change and rash.  Neurological: Positive for syncope. Negative for seizures and headaches.  Psychiatric/Behavioral: Negative for confusion.  All other systems reviewed and are negative.   Physical Exam Updated Vital Signs BP (!) 149/72 (BP Location: Right Arm)   Pulse (!) 59   Temp 98.1 F (36.7 C) (Oral)   Resp 18   Ht 5\' 2"  (1.575 m)   Wt 71.8 kg   SpO2 97%   BMI 28.95 kg/m   Physical Exam Vitals and nursing note reviewed.  Constitutional:      General: She is not in acute distress.    Appearance: She is well-developed.  HENT:     Head: Normocephalic and atraumatic.  Eyes:     Conjunctiva/sclera: Conjunctivae normal.  Cardiovascular:     Rate and Rhythm: Normal rate and regular rhythm.     Pulses: Normal pulses.     Heart sounds: Normal heart sounds. No murmur.  Pulmonary:     Effort: Pulmonary effort is normal. No respiratory distress.     Breath sounds: Normal breath sounds.  Abdominal:     Palpations: Abdomen is soft.     Tenderness: There is no abdominal tenderness. There is no guarding.  Musculoskeletal:     Cervical back: Normal range of motion and neck supple. No rigidity.     Right lower leg: No edema.     Left lower leg: No edema.   Skin:    General: Skin is warm and dry.  Neurological:     Mental Status: She is alert.     Comments: Mental Status:  Alert, oriented, thought content appropriate, able to give a coherent history. Speech fluent without evidence of aphasia. Able to follow 2 step commands without difficulty.  Cranial Nerves:  II:  Peripheral visual fields grossly normal, pupils equal, round, reactive to light III,IV, VI: ptosis not present, extra-ocular motions intact bilaterally  V,VII: smile symmetric, facial light touch sensation equal VIII: hearing  grossly normal to voice  X: uvula elevates symmetrically  XI: bilateral shoulder shrug symmetric and strong XII: midline tongue extension without fassiculations Motor:  Normal tone. 5/5 in upper and lower extremities bilaterally including strong and equal grip strength and dorsiflexion/plantar flexion Cerebellar: normal finger-to-nose with bilateral upper extremities Gait: normal gait and balance CV: distal pulses palpable throughout    Psychiatric:        Mood and Affect: Mood normal.        Behavior: Behavior normal.     ED Results / Procedures / Treatments   Labs (all labs ordered are listed, but only abnormal results are displayed) Labs Reviewed  COMPREHENSIVE METABOLIC PANEL - Abnormal; Notable for the following components:      Result Value   Glucose, Bld 104 (*)    All other components within normal limits  CBC WITH DIFFERENTIAL/PLATELET - Abnormal; Notable for the following components:   WBC 12.9 (*)    Neutro Abs 10.2 (*)    All other components within normal limits  SARS CORONAVIRUS 2 BY RT PCR (HOSPITAL ORDER, [facility])  URINALYSIS, ROUTINE W REFLEX MICROSCOPIC  MAGNESIUM  CBC  BASIC METABOLIC PANEL  TSH  TROPONIN I (HIGH SENSITIVITY)    EKG EKG Interpretation  Date/Time:  Tuesday Oct 29 2019 13:20:14 EDT Ventricular Rate:  58 PR Interval:  138 QRS Duration: 72 QT Interval:  426 QTC  Calculation: 418 R Axis:   30 Text Interpretation: Sinus bradycardia Otherwise normal ECG No significant change since last tracing Confirmed by Calvert Cantor [phone]) on 10/29/2019 1:50:49 PM   Radiology DG Chest 2 View  Result Date: 10/29/2019 CLINICAL DATA:  Cough, congestion, syncope EXAM: CHEST - 2 VIEW COMPARISON:  10/21/2019 FINDINGS: Heart and mediastinal contours are within normal limits. No focal opacities or effusions. No acute bony abnormality. IMPRESSION: No active cardiopulmonary disease. Electronically Signed   By: Rolm Baptise M.D.   On: 10/29/2019 14:00      Procedures Procedures (including critical care time)  Medications Ordered in ED Medications                          ED Course  I have reviewed the triage vital signs and the nursing notes.  Pertinent labs & imaging results that were available during my care of the patient were reviewed by me and considered in my medical decision making (see chart for details).  Clinical Course as of Oct 29 2222  Tue Oct 29, 2019  1444 I spoke with Dr. Doristine Bosworth who will see patient for admission.    [EH]    Clinical Course User Index [EH] Ollen Gross   MDM Rules/Calculators/A&P                     Patient is a 69 year old woman who presents today with her daughter for evaluation after she had a 10-minute syncopal event with full loss of postural tone.  This was not related to position changes.  Here she is afebrile, not tachycardic or tachypneic.  Chest x-ray obtained without evidence of acute abnormalities.  Labs are obtained and reviewed, minimal leukocytosis at 12.9 however she does not have anemia or significant electrolyte derangements.  Troponin is not elevated.  Covid test and UA are negative.  Given that she had a full postural loss of tone for 10 minutes I am concerned for a serious cause of her syncope including cardiac dysrhythmia or  other significant cause. I spoke with the hospitalist who  will see the patient for admission.  CT scan was not ordered as she is neurovascularly intact on my exam.  Note: Portions of this report may have been transcribed using voice recognition software. Every effort was made to ensure accuracy; however, inadvertent computerized transcription errors may be present  Final Clinical Impression(s) / ED Diagnoses Final diagnoses:  Syncope and collapse    Rx / DC Orders ED Discharge Orders    None       Norman Clay 10/29/19 2226    Pollyann Savoy, MD 10/31/19 [phone]

## 2019-10-29 NOTE — ED Triage Notes (Signed)
Patient BIBA from home, reports episode of dizziness while standing in kitchen. Patient reports having syncopal episode while sitting down. Denies falling. Hx of hypertension and vertigo.   BP 178/80 CBG 110 HR 72 95% RA 97.1 T

## 2019-10-30 ENCOUNTER — Observation Stay (HOSPITAL_COMMUNITY)
Admission: EM | Admit: 2019-10-30 | Discharge: 2019-10-30 | Disposition: A | Discharge status: Home / Self Care | Payer: Medicare HMO | Source: Home / Self Care | Attending: Family Medicine | Admitting: Family Medicine

## 2019-10-30 ENCOUNTER — Observation Stay (HOSPITAL_COMMUNITY): Payer: Medicare HMO

## 2019-10-30 ENCOUNTER — Observation Stay (HOSPITAL_BASED_OUTPATIENT_CLINIC_OR_DEPARTMENT_OTHER): Payer: Medicare HMO

## 2019-10-30 DIAGNOSIS — E785 Hyperlipidemia, unspecified: Secondary | ICD-10-CM | POA: Diagnosis not present

## 2019-10-30 DIAGNOSIS — R55 Syncope and collapse: Secondary | ICD-10-CM

## 2019-10-30 DIAGNOSIS — I10 Essential (primary) hypertension: Secondary | ICD-10-CM | POA: Diagnosis not present

## 2019-10-30 DIAGNOSIS — F17200 Nicotine dependence, unspecified, uncomplicated: Secondary | ICD-10-CM

## 2019-10-30 DIAGNOSIS — R42 Dizziness and giddiness: Secondary | ICD-10-CM | POA: Diagnosis not present

## 2019-10-30 LAB — BASIC METABOLIC PANEL
Anion gap: 10 (ref 5–15)
BUN: 17 mg/dL (ref 8–23)
CO2: 23 mmol/L (ref 22–32)
Calcium: 8.6 mg/dL — ABNORMAL LOW (ref 8.9–10.3)
Chloride: 102 mmol/L (ref 98–111)
Creatinine, Ser: 0.7 mg/dL (ref 0.44–1.00)
GFR calc Af Amer: >60 mL/min (ref >60)
GFR calc non Af Amer: >60 mL/min (ref >60)
Glucose, Bld: 89 mg/dL (ref 70–99)
Potassium: 3.7 mmol/L (ref 3.5–5.1)
Sodium: 135 mmol/L (ref 135–145)

## 2019-10-30 LAB — CBC
HCT: 41.1 % (ref 36.0–46.0)
Hemoglobin: 13.5 g/dL (ref 12.0–15.0)
MCH: 29.7 pg (ref 26.0–34.0)
MCHC: 32.8 g/dL (ref 30.0–36.0)
MCV: 90.3 fL (ref 80.0–100.0)
Platelets: 193 10*3/uL (ref 150–400)
RBC: 4.55 MIL/uL (ref 3.87–5.11)
RDW: 13.7 % (ref 11.5–15.5)
WBC: 9.3 10*3/uL (ref 4.0–10.5)
nRBC: 0 % (ref 0.0–0.2)

## 2019-10-30 LAB — TSH: TSH: 0.901 u[IU]/mL (ref 0.350–4.500)

## 2019-10-30 MED ORDER — ASPIRIN EC 81 MG PO TBEC: 81.0000 mg | DELAYED_RELEASE_TABLET | Freq: Every day | ORAL | 1 refills | Status: AC

## 2019-10-30 MED ORDER — NICOTINE 21 MG/24HR TD PT24
21.0000 mg | MEDICATED_PATCH | Freq: Every day | TRANSDERMAL | Status: DC
  Administered 2019-10-30: 21 mg via TRANSDERMAL
  Filled 2019-10-30: qty 1

## 2019-10-30 NOTE — Procedures (Addendum)
Patient Name: Jaclyn Golden  MRN: [id number]  Epilepsy Attending: Charlsie Quest  Referring Physician/Provider: Dr Hughie Closs Date: 10/30/2019 Duration: 24.24 mins  Patient history: 69yo F with dizziness and syncope. EEG to evaluate for seizure.  Level of alertness: awake, drowsy  AEDs during EEG study: None  Technical aspects: This EEG study was done with scalp electrodes positioned according to the 10-20 International system of electrode placement. Electrical activity was acquired at a sampling rate of 500Hz  and reviewed with a high frequency filter of 70Hz  and a low frequency filter of 1Hz . EEG data were recorded continuously and digitally stored.   DESCRIPTION: The posterior dominant rhythm consists of 8-9 Hz activity of moderate voltage (25-35 uV) seen predominantly in posterior head regions, symmetric and reactive to eye opening and eye closing. Drowsiness was characterized by attenuation of the posterior background rhythm. Physiologic photic driving was seen during photic stimulation. Hyperventilation was not performed.  IMPRESSION: This study is within normal limits. No seizures or epileptiform discharges were seen throughout the recording.  Reynald Woods 

## 2019-10-30 NOTE — Progress Notes (Signed)
Carotid duplex       has been completed. Preliminary results can be found under CV proc through chart review. Gean Laursen, BS, RDMS, RVT   

## 2019-10-30 NOTE — Discharge Instructions (Signed)
Follow with Jaclyn Joe, MD in 5-7 days  Please get a complete blood count and chemistry panel checked by your Primary MD at your next visit, and again as instructed by your Primary MD. Please get your medications reviewed and adjusted by your Primary MD.  Please request your Primary MD to go over all Hospital Tests and Procedure/Radiological results at the follow up, please get all Hospital records sent to your Prim MD by signing hospital release before you go home.  In some cases, there will be blood work, cultures and biopsy results pending at the time of your discharge. Please request that your primary care M.D. goes through all the records of your hospital data and follows up on these results.  If you had Pneumonia of Lung problems at the Hospital: Please get a 2 view Chest X ray done in 6-8 weeks after hospital discharge or sooner if instructed by your Primary MD.  If you have Congestive Heart Failure: Please call your Cardiologist or Primary MD anytime you have any of the following symptoms:  1) 3 pound weight gain in 24 hours or 5 pounds in 1 week  2) shortness of breath, with or without a dry hacking cough  3) swelling in the hands, feet or stomach  4) if you have to sleep on extra pillows at night in order to breathe  Follow cardiac low salt diet and 1.5 lit/day fluid restriction.  If you have diabetes Accuchecks 4 times/day, Once in AM empty stomach and then before each meal. Log in all results and show them to your primary doctor at your next visit. If any glucose reading is under 80 or above 300 call your primary MD immediately.  If you have Seizure/Convulsions/Epilepsy: Please do not drive, operate heavy machinery, participate in activities at heights or participate in high speed sports until you have seen by Primary MD or a Neurologist and advised to do so again. Per [facility] statutes, patients with seizures are not allowed to drive until they have been  seizure-free for six months.  Use caution when using heavy equipment or power tools. Avoid working on ladders or at heights. Take showers instead of baths. Ensure the water temperature is not too high on the home water heater. Do not go swimming alone. Do not lock yourself in a room alone (i.e. bathroom). When caring for infants or small children, sit down when holding, feeding, or changing them to minimize risk of injury to the child in the event you have a seizure. Maintain good sleep hygiene. Avoid alcohol.   If you had Gastrointestinal Bleeding: Please ask your Primary MD to check a complete blood count within one week of discharge or at your next visit. Your endoscopic/colonoscopic biopsies that are pending at the time of discharge, will also need to followed by your Primary MD.  Get Medicines reviewed and adjusted. Please take all your medications with you for your next visit with your Primary MD  Please request your Primary MD to go over all hospital tests and procedure/radiological results at the follow up, please ask your Primary MD to get all Hospital records sent to his/her office.  If you experience worsening of your admission symptoms, develop shortness of breath, life threatening emergency, suicidal or homicidal thoughts you must seek medical attention immediately by calling 911 or calling your MD immediately  if symptoms less severe.  You must read complete instructions/literature along with all the possible adverse reactions/side effects for all the Medicines you take  and that have been prescribed to you. Take any new Medicines after you have completely understood and accpet all the possible adverse reactions/side effects.   Do not drive or operate heavy machinery when taking Pain medications.   Do not take more than prescribed Pain, Sleep and Anxiety Medications  Special Instructions: If you have smoked or chewed Tobacco  in the last 2 yrs please stop smoking, stop any regular  Alcohol  and or any Recreational drug use.  Wear Seat belts while driving.  Please note You were cared for by a hospitalist during your hospital stay. If you have any questions about your discharge medications or the care you received while you were in the hospital after you are discharged, you can call the unit and asked to speak with the hospitalist on call if the hospitalist that took care of you is not available. Once you are discharged, your primary care physician will handle any further medical issues. Please note that NO REFILLS for any discharge medications will be authorized once you are discharged, as it is imperative that you return to your primary care physician (or establish a relationship with a primary care physician if you do not have one) for your aftercare needs so that they can reassess your need for medications and monitor your lab values.  You can reach the hospitalist office at phone [phone] or fax [phone]   If you do not have a primary care physician, you can call [phone] for a physician referral.  Activity: As tolerated with Full fall precautions use walker/cane & assistance as needed    Diet: low sodium  Disposition Home

## 2019-10-30 NOTE — Progress Notes (Signed)
EEG Completed; Results Pending  

## 2019-10-30 NOTE — Evaluation (Signed)
Physical Therapy Evaluation Patient Details Name: Jaclyn Golden MRN: [id number] DOB: [birth year] Today's Date: 10/30/2019   History of Present Illness  69 yo female admitted with syncope. Hx of vertigo, OA  Clinical Impression  On eval, pt was Min guard assist for mobility. She walked ~115 feet while pushing dynamap down hallway. No overt LOB requiring external assist but pt is unsteady (not a new impairment). She c/o mild "wooziness" during session. Symptoms/events reported on admission do not seem indicative of BBPV. Performed modified hallpike dix test-no nystagmus or spinning; she did report some dizziness. Did not observe any symptoms with positional changes with functional mobility. Pt also reports chronic ear ringing. If symptoms persist, encouraged pt to consider going to OP PT and/or ENT MD.    Follow Up Recommendations Outpatient PT;Supervision for mobility/OOB(for continued assessment of vertigo and balance training)    Equipment Recommendations  None recommended by PT    Recommendations for Other Services       Precautions / Restrictions Precautions Precautions: Fall Restrictions Weight Bearing Restrictions: No      Mobility  Bed Mobility Overal bed mobility: Modified Independent                Transfers Overall transfer level: Needs assistance   Transfers: Sit to/from Stand Sit to Stand: Supervision            Ambulation/Gait Ambulation/Gait assistance: Min guard Gait Distance (Feet): 115 Feet Assistive device: (dynamap) Gait Pattern/deviations: Step-through pattern;Decreased stride length     General Gait Details: Min guard for safety.  Stairs            Wheelchair Mobility    Modified Rankin (Stroke Patients Only)       Balance Overall balance assessment: Needs assistance           Standing balance-Leahy Scale: Fair                               Pertinent Vitals/Pain Pain Assessment: No/denies pain     Home Living Family/patient expects to be discharged to:: Private residence Living Arrangements: Children Available Help at Discharge: Family;Available PRN/intermittently Type of Home: House Home Access: Stairs to enter;Ramped entrance   Entrance Stairs-Number of Steps: 2 Home Layout: One level Home Equipment: None      Prior Function Level of Independence: Independent               Hand Dominance        Extremity/Trunk Assessment   Upper Extremity Assessment Upper Extremity Assessment: Overall WFL for tasks assessed    Lower Extremity Assessment Lower Extremity Assessment: Generalized weakness    Cervical / Trunk Assessment Cervical / Trunk Assessment: Normal  Communication   Communication: No difficulties  Cognition Arousal/Alertness: Awake/alert Behavior During Therapy: WFL for tasks assessed/performed Overall Cognitive Status: Within Functional Limits for tasks assessed                                        General Comments      Exercises     Assessment/Plan    PT Assessment Patient needs continued PT services  PT Problem List Decreased mobility;Decreased balance       PT Treatment Interventions Gait training;Therapeutic activities;Therapeutic exercise;Patient/family education;Balance training;Functional mobility training;DME instruction    PT Goals (Current goals can be found in the Care Plan section)  Acute Rehab PT Goals Patient Stated Goal: figure out what caused syncope episode PT Goal Formulation: With patient Time For Goal Achievement: 11/13/19 Potential to Achieve Goals: Good    Frequency Min 3X/week   Barriers to discharge        Co-evaluation               AM-PAC PT "6 Clicks" Mobility  Outcome Measure Help needed turning from your back to your side while in a flat bed without using bedrails?: None Help needed moving from lying on your back to sitting on the side of a flat bed without using  bedrails?: None Help needed moving to and from a bed to a chair (including a wheelchair)?: A Little Help needed standing up from a chair using your arms (e.g., wheelchair or bedside chair)?: A Little Help needed to walk in hospital room?: A Little Help needed climbing 3-5 steps with a railing? : A Little 6 Click Score: 20    End of Session Equipment Utilized During Treatment: Gait belt Activity Tolerance: Patient tolerated treatment well Patient left: in bed;with call bell/phone within reach;with bed alarm set   PT Visit Diagnosis: Unsteadiness on feet (R26.81)    Time: 6734-1937 PT Time Calculation (min) (ACUTE ONLY): 36 min   Charges:   PT Evaluation $PT Eval Low Complexity: 1 Low PT Treatments $Gait Training: 8-22 mins           Doreatha Massed, PT Acute Rehabilitation

## 2019-10-30 NOTE — Discharge Summary (Signed)
Physician Discharge Summary  Jaclyn Golden DOB: [birth year] DOA: 10/29/2019  PCP: Tally JoeSwayne, David, MD  Admit date: 10/29/2019 Discharge date: 10/30/2019  Admitted From: home Disposition:  home  Recommendations for Outpatient Follow-up:  1. Follow up with PCP in 1-2 weeks 2. Please refer to vascular surgery as an outpatient  Home Health: None Equipment/Devices: None  Discharge Condition: Stable CODE STATUS: Full code Diet recommendation: Heart healthy, low-sodium diet  HPI: Per admitting MD, Jaclyn Golden is a 69 y.o. female with medical history significant of hypertension, hyperlipidemia and tobacco dependency was brought into the emergency department due to episode of passing out.  Daughter at the bedside.  According to the daughter and the patient, patient was working in her kitchen and started feeling dizzy.  She usually has intermittent dizziness but this time the dizziness was slightly different than her usual.  She sat down in the chair and then soon after that, she lost her consciousness as well as postural tone for about 10 minutes.  EMS was called.  Patient was confused after she regained her consciousness.  The daughter did not notice any tremors, jerking movements, bowel or bladder incontinence or any tongue biting.  Patient was subsequently brought to the emergency department.  No previous episode of as such.  No history of CAD, MI, any arrhythmia, stroke, any recent travel or any sick contact.  Patient has dry cough and she was diagnosed with acute bronchitis 10 days ago for which she completed azithromycin and she is currently on tapering dose of steroids.  She completed her 2 doses of COVID-19 Pfizer vaccine with the last being on 15 April. ED Course: Upon arrival to ED, she was slightly hypertensive.  Otherwise stable.  Mild leukocytosis of 12.9.  Troponin negative.  Hospital service was consulted for further management.  Hospital Course / Discharge  diagnoses: Syncope-unclear etiology, she did have some dizziness prior to passing out.  She had no focal neurologic deficit.  She was monitored on telemetry without any significant arrhythmias.  She underwent a 2D echo which was unremarkable, showed normal EF without any wall motion abnormalities.  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  She does have mild to moderate chronic small vessel ischemic changes.  It is within the realm of possibility that she may have had a TIA, start aspirin.  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 Essential hypertension-initially elevated, but controlled.  She is afraid that some of this dizziness was caused by Norvasc however she was not hypotensive when EMS arrived.  Have advised her that she could continue using hypothyroid rather have her follow-up with PCP for alternative changes Hyperlipidemia- Resume home statin. Leukocytosis -Nonspecific and likely reactive.  No signs of infection.  Repeat CBC in the morning with a normal WBC Tobacco use-strongly counseled for cessation  Discharge Instructions   Allergies as of 10/30/2019      Reactions   Penicillins Other (See Comments)   Pass out  Has patient had a PCN reaction causing immediate rash, facial/tongue/throat swelling, SOB or lightheadedness with hypotension:YES Has patient had a PCN reaction causing severe rash involving mucus membranes or skin necrosis:No Has patient had a PCN reaction that required hospitalization:NO Has patient had a PCN reaction  occurring within the last 10 years:NO If all of the above answers are "NO", then may proceed with Cephalosporin use.      Medication List    TAKE these medications   amLODipine 5  MG tablet Commonly known as: NORVASC Take 5 mg by mouth every evening.   aspirin EC 81 MG tablet Take 1 tablet (81 mg total) by mouth daily.   etodolac 400 MG tablet Commonly known as: LODINE Take 1 tablet (400 mg total) by mouth 2 (two) times daily as needed. What changed: reasons to take this   Fish Oil 1000 MG Caps Take 1 capsule by mouth daily.   Hydromet 5-1.5 MG/5ML syrup Generic drug: HYDROcodone-homatropine Take 5 mLs by mouth every 6 (six) hours as needed for cough.   meclizine 25 MG tablet Commonly known as: ANTIVERT Take 1 tablet (25 mg total) by mouth 3 (three) times daily as needed for dizziness.   multivitamin with minerals tablet Take 1 tablet by mouth daily.   OCUVITE ADULT 50+ PO Take 1 tablet by mouth daily.   predniSONE 20 MG tablet Commonly known as: DELTASONE Take 20 mg by mouth 2 (two) times daily. 9 day supply   rosuvastatin 5 MG tablet Commonly known as: CRESTOR Take 5 mg by mouth every other day.   VITAMIN D3 PO Take 1 tablet by mouth daily.      Follow-up Information    Tally Joe, MD. Schedule an appointment as soon as possible for a visit in 1 week(s).   Specialty: Family Medicine Contact information: [phone] [address] [place] [address] [phone]           Consultations:  None   Procedures/Studies:  DG Chest 2 View  Result Date: 10/29/2019 CLINICAL DATA:  Cough, congestion, syncope EXAM: CHEST - 2 VIEW COMPARISON:  10/21/2019 FINDINGS: Heart and mediastinal contours are within normal limits. No focal opacities or effusions. No acute bony abnormality. IMPRESSION: No active cardiopulmonary disease. Electronically Signed   By: Charlett Nose M.D.   On: 10/29/2019 14:00   DG Chest 2 View  Result Date: 10/21/2019 CLINICAL DATA:  Cough, shortness of breath, smoker EXAM: CHEST - 2 VIEW COMPARISON:  01/15/2019 FINDINGS: The heart size and mediastinal contours are within normal limits. Both lungs are clear.  Numerous tiny, benign calcified nodules throughout the lungs. The visualized skeletal structures are unremarkable. IMPRESSION: No acute abnormality of the lungs. Electronically Signed   By: Lauralyn Primes M.D.   On: 10/21/2019 16:51   CT HEAD WO CONTRAST  Result Date: 10/29/2019 CLINICAL DATA:  Syncope EXAM: CT HEAD WITHOUT CONTRAST TECHNIQUE: Contiguous axial images were obtained from the base of the skull through the vertex without intravenous contrast. COMPARISON:  07/28/2015 FINDINGS: Brain: There is atrophy and chronic small vessel disease changes. No acute intracranial abnormality. Specifically, no hemorrhage, hydrocephalus, mass lesion, acute infarction, or significant intracranial injury. Vascular: No hyperdense vessel or unexpected calcification. Skull: No acute calvarial abnormality. Sinuses/Orbits: Visualized paranasal sinuses and mastoids clear. Orbital soft tissues unremarkable. Other: None IMPRESSION: Atrophy, chronic microvascular disease. No acute intracranial abnormality. Electronically Signed   By: Charlett Nose M.D.   On: 10/29/2019 15:34   MR BRAIN WO CONTRAST  Result Date: 10/30/2019 CLINICAL DATA:  Syncope.  Dizzy episode. EXAM: MRI HEAD WITHOUT CONTRAST TECHNIQUE: Multiplanar, multiecho pulse sequences of the brain and surrounding structures were obtained without intravenous contrast. COMPARISON:  Head CT 10/29/2019. FINDINGS: Brain: Diffusion imaging does not show any acute or subacute infarction. 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 The brainstem is normal. Cerebral hemispheres show mild to moderate chronic small-vessel ischemic changes of the deep white matter considering age. No cortical or large vessel territory infarction. No mass lesion, hemorrhage, hydrocephalus or extra-axial collection. Incidental cyst of the choroid fissure on the left. Vascular: Major vessels at the base of the brain show flow. Skull and upper cervical spine: Negative Sinuses/Orbits:  Clear/normal.  Previous lens implant on the right. Other: None IMPRESSION: No acute or subacute finding to explain the clinical presentation. 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 Mild to moderate chronic small-vessel ischemic change of the cerebral hemispheric white matter. Electronically Signed   By: Paulina Fusi M.D.   On: 10/30/2019 11:02   EEG adult  Result Date: 10/30/2019 Charlsie Quest, MD     10/30/2019  4:42 PM Patient Name: Jaclyn Golden MRN: [id number] Epilepsy Attending: Charlsie Quest Referring Physician/Provider: Dr Hughie Closs Date: 10/30/2019 Duration: 24.24 mins Patient history: 69yo F with dizziness and syncope. EEG to evaluate for seizure. Level of alertness: awake, drowsy AEDs during EEG study: None Technical aspects: This EEG study was done with scalp electrodes positioned according to the 10-20 International system of electrode placement. Electrical activity was acquired at a sampling rate of 500Hz  and reviewed with a high frequency filter of 70Hz  and a low frequency filter of 1Hz . EEG data were recorded continuously and digitally stored. DESCRIPTION: The posterior dominant rhythm consists of 8-9 Hz activity of moderate voltage (25-35 uV) seen predominantly in posterior head regions, symmetric and reactive to eye opening and eye closing. Drowsiness was characterized by attenuation of the posterior background rhythm. Physiologic photic driving was seen during photic stimulation. Hyperventilation was not performed. IMPRESSION: This study is within normal limits. No seizures or epileptiform discharges were seen throughout the recording.   ECHOCARDIOGRAM COMPLETE  Result Date: 10/29/2019    ECHOCARDIOGRAM REPORT   Patient Name:   Jaclyn Golden Date of Exam: 10/29/2019 Medical Rec #:  12/29/2019      Height:       62.0 in Accession #:    Jaclyn Ginger     Weight:       156.5 lb Date of Birth:  May 13, 1951      BSA:          1.723 m Patient Age:    69 years        BP:           123/68 mmHg Patient Gender: F              HR:           58 bpm. Exam Location:  Inpatient Procedure: 2D Echo Indications:    Syncope R55  History:        Patient has no prior history of Echocardiogram examinations.                 Risk Factors:Hypertension.  Sonographer:    [id number] RDCS (AE) Referring Phys: [id number] RAVI PAHWANI IMPRESSIONS  1. Left ventricular ejection fraction, by estimation, is 60 to 65%. The left ventricle has normal function. The left ventricle has no regional wall motion abnormalities. Left ventricular diastolic parameters are consistent with Grade I diastolic dysfunction (impaired relaxation).  2. Right ventricular systolic function is normal. The right ventricular size is normal. There is normal pulmonary artery systolic pressure. The estimated right ventricular systolic pressure is 23.6 mmHg.  3. The mitral valve is normal in structure.  No evidence of mitral valve regurgitation. No evidence of mitral stenosis.  4. The aortic valve is tricuspid. Aortic valve regurgitation is not visualized. Mild aortic valve sclerosis is present, with no evidence of aortic valve stenosis.  5. The inferior vena cava is normal in size with greater than 50% respiratory variability, suggesting right atrial pressure of 3 mmHg. FINDINGS  Left Ventricle: Left ventricular ejection fraction, by estimation, is 60 to 65%. The left ventricle has normal function. The left ventricle has no regional wall motion abnormalities. The left ventricular internal cavity size was normal in size. There is  no left ventricular hypertrophy. Left ventricular diastolic parameters are consistent with Grade I diastolic dysfunction (impaired relaxation). Indeterminate filling pressures. Right Ventricle: The right ventricular size is normal. No increase in right ventricular wall thickness. Right ventricular systolic function is normal. There is normal pulmonary artery systolic pressure. The tricuspid regurgitant  velocity is 2.27 m/s, and  with an assumed right atrial pressure of 3 mmHg, the estimated right ventricular systolic pressure is 31.5 mmHg. Left Atrium: Left atrial size was normal in size. Right Atrium: Right atrial size was normal in size. Pericardium: There is no evidence of pericardial effusion. Mitral Valve: The mitral valve is normal in structure. Normal mobility of the mitral valve leaflets. Mild mitral annular calcification. No evidence of mitral valve regurgitation. No evidence of mitral valve stenosis. Tricuspid Valve: The tricuspid valve is normal in structure. Tricuspid valve regurgitation is trivial. No evidence of tricuspid stenosis. Aortic Valve: The aortic valve is tricuspid. Aortic valve regurgitation is not visualized. Mild aortic valve sclerosis is present, with no evidence of aortic valve stenosis. Pulmonic Valve: The pulmonic valve was normal in structure. Pulmonic valve regurgitation is not visualized. No evidence of pulmonic stenosis. Aorta: The aortic root is normal in size and structure. Venous: The inferior vena cava is normal in size with greater than 50% respiratory variability, suggesting right atrial pressure of 3 mmHg. IAS/Shunts: No atrial level shunt detected by color flow Doppler.  LEFT VENTRICLE PLAX 2D LVIDd:         4.80 cm  Diastology LVIDs:         3.10 cm  LV e' lateral:   7.40 cm/s LV PW:         1.00 cm  LV E/e' lateral: 10.4 LV IVS:        1.20 cm  LV e' medial:    4.35 cm/s LVOT diam:     1.90 cm  LV E/e' medial:  17.6 LV SV:         71 LV SV Index:   41 LVOT Area:     2.84 cm  RIGHT VENTRICLE RV S prime:     15.00 cm/s TAPSE (M-mode): 2.0 cm LEFT ATRIUM             Index       RIGHT ATRIUM           Index LA diam:        3.60 cm 2.09 cm/m  RA Area:     10.40 cm LA Vol (A2C):   47.0 ml 27.28 ml/m RA Volume:   16.70 ml  9.69 ml/m LA Vol (A4C):   34.9 ml 20.26 ml/m LA Biplane Vol: 40.4 ml 23.45 ml/m  AORTIC VALVE LVOT Vmax:   101.00 cm/s LVOT Vmean:  65.600 cm/s LVOT  VTI:    0.251 m  AORTA Ao Root diam: 2.40 cm MITRAL VALVE  TRICUSPID VALVE MV Area (PHT): 2.32 cm     TR Peak grad:   20.6 mmHg MV Decel Time: 327 msec     TR Vmax:        227.00 cm/s MV E velocity: 76.60 cm/s MV A velocity: 110.00 cm/s  SHUNTS MV E/A ratio:  0.70         Systemic VTI:  0.25 m                             Systemic Diam: 1.90 cm Thurmon Fair MD Electronically signed by Thurmon Fair MD Signature Date/Time: 10/29/2019/4:06:35 PM    Final    VAS US CAROTID  Result Date: 10/30/2019 Carotid Arterial Duplex Study Indications:  Left bruit and Syncope. Risk Factors: Hypertension. Performing Technologist: Jeb Levering RDMS, RVT  Examination Guidelines: A complete evaluation includes B-mode imaging, spectral Doppler, color Doppler, and power Doppler as needed of all accessible portions of each vessel. Bilateral testing is considered an integral part of a complete examination. Limited examinations for reoccurring indications may be performed as noted.  Right Carotid Findings: +----------+--------+--------+--------+------------------+------------------+           PSV cm/sEDV cm/sStenosisPlaque DescriptionComments           +----------+--------+--------+--------+------------------+------------------+ CCA Prox  88      9                                                    +----------+--------+--------+--------+------------------+------------------+ CCA Distal88      17              homogeneous                          +----------+--------+--------+--------+------------------+------------------+ ICA Prox  133     33      1-39%   heterogenous      upper end of range +----------+--------+--------+--------+------------------+------------------+ ICA Distal113     22                                                   +----------+--------+--------+--------+------------------+------------------+ ECA       174     19                                                    +----------+--------+--------+--------+------------------+------------------+ +----------+--------+-------+----------------+-------------------+           PSV cm/sEDV cmsDescribe        Arm Pressure (mmHG) +----------+--------+-------+----------------+-------------------+ [id number]            Multiphasic, WNL                    +----------+--------+-------+----------------+-------------------+ +---------+--------+--+--------+-+---------+ VertebralPSV cm/s58EDV cm/s9Antegrade +---------+--------+--+--------+-+---------+  Left Carotid Findings: +----------+--------+--------+--------+------------------+--------+           PSV cm/sEDV cm/sStenosisPlaque DescriptionComments +----------+--------+--------+--------+------------------+--------+ CCA Prox  100     18                                         +----------+--------+--------+--------+------------------+--------+  CCA Distal104     18                                         +----------+--------+--------+--------+------------------+--------+ ICA Prox  185     45      40-59%  heterogenous               +----------+--------+--------+--------+------------------+--------+ ICA Mid   107     17                                         +----------+--------+--------+--------+------------------+--------+ ICA Distal111     24                                         +----------+--------+--------+--------+------------------+--------+ ECA       203     4                                          +----------+--------+--------+--------+------------------+--------+ +----------+--------+--------+----------------+-------------------+           PSV cm/sEDV cm/sDescribe        Arm Pressure (mmHG) +----------+--------+--------+----------------+-------------------+ [id number]             Multiphasic, WNL                    +----------+--------+--------+----------------+-------------------+  +---------+--------+--+--------+--+---------+ VertebralPSV cm/s76EDV cm/s16Antegrade +---------+--------+--+--------+--+---------+   Summary: Right Carotid: Velocities in the right ICA are consistent with a 1-39% stenosis. Left Carotid: Velocities in the left ICA are consistent with a 40-59% stenosis. Vertebrals:  Bilateral vertebral arteries demonstrate antegrade flow. Subclavians: Normal flow hemodynamics were seen in bilateral subclavian              arteries. *See table(s) above for measurements and observations.  Electronically signed by Delia Heady MD on 10/30/2019 at 4:04:50 PM.    Final       Subjective: - no chest pain, shortness of breath, no abdominal pain, nausea or vomiting.   Discharge Exam: BP (!) 145/70 (BP Location: Right Arm)   Pulse 75   Temp (!) 97.2 F (36.2 C) (Oral)   Resp 16   Ht  (1.575 m)   Wt 71.8 kg   SpO2 98%   BMI 28.95 kg/m   General: Pt is alert, awake, not in acute distress Cardiovascular: RRR, S1/S2 +, no rubs, no gallops Respiratory: CTA bilaterally, no wheezing, no rhonchi Abdominal: Soft, NT, ND, bowel sounds + Extremities: no edema, no cyanosis    The results of significant diagnostics from this hospitalization (including imaging, microbiology, ancillary and laboratory) are listed below for reference.     Microbiology: Recent Results (from the past 240 hour(s))  SARS Coronavirus 2 by RT PCR (hospital order, performed in [facility] hospital lab) Nasopharyngeal Nasopharyngeal Swab     Status: None   Collection Time: 10/29/19  4:30 PM   Specimen: Nasopharyngeal Swab  Result Value Ref Range Status   SARS Coronavirus 2 NEGATIVE NEGATIVE Final    Comment: (NOTE) SARS-CoV-2 target nucleic acids are NOT DETECTED. The SARS-CoV-2 RNA is generally detectable in upper and lower respiratory specimens during  the acute phase of infection. The lowest concentration of SARS-CoV-2 viral copies this assay can detect is 250 copies / mL. A  negative result does not preclude SARS-CoV-2 infection and should not be used as the sole basis for treatment or other patient management decisions.  A negative result may occur with improper specimen collection / handling, submission of specimen other than nasopharyngeal swab, presence of viral mutation(s) within the areas targeted by this assay, and inadequate number of viral copies (<250 copies / mL). A negative result must be combined with clinical observations, patient history, and epidemiological information. Fact Sheet for Patients:   BoilerBrush.com.cy Fact Sheet for Healthcare Providers: https://pope.com/ This test is not yet approved or cleared  by the [place] FDA and has been authorized for detection and/or diagnosis of SARS-CoV-2 by FDA under an Emergency Use Authorization (EUA).  This EUA will remain in effect (meaning this test can be used) for the duration of the COVID-19 declaration under Section 564(b)(1) of the Act, 21 U.S.C. section 360bbb-3(b)(1), unless the authorization is terminated or revoked sooner. Performed at [facility], [address] [place], [place] [address]      Labs: Basic Metabolic Panel: Recent Labs  Lab 10/29/19 1227 10/30/19 0446  NA 135 135  K 4.7 3.7  CL 98 102  CO2 26 23  GLUCOSE 104* 89  BUN 21 17  CREATININE 0.83 0.70  CALCIUM 9.0 8.6*  MG 2.1  --    Liver Function Tests: Recent Labs  Lab 10/29/19 1227  AST 27  ALT 19  ALKPHOS 74  BILITOT 0.7  PROT 6.9  ALBUMIN 4.1   CBC: Recent Labs  Lab 10/29/19 1227 10/30/19 0446  WBC 12.9* 9.3  NEUTROABS 10.2*  --   HGB 14.9 13.5  HCT 45.8 41.1  MCV 90.3 90.3  PLT 217 193   CBG: No results for input(s): GLUCAP in the last 168 hours. Hgb A1c No results for input(s): HGBA1C in the last 72 hours. Lipid Profile No results for input(s): CHOL, HDL, LDLCALC, TRIG, CHOLHDL, LDLDIRECT in the last 72  hours. Thyroid function studies Recent Labs    10/30/19 0446  TSH 0.901   Urinalysis    Component Value Date/Time   COLORURINE YELLOW 10/29/2019 1416   APPEARANCEUR CLEAR 10/29/2019 1416   LABSPEC 1.008 10/29/2019 1416   PHURINE 6.0 10/29/2019 1416   GLUCOSEU NEGATIVE 10/29/2019 1416   HGBUR NEGATIVE 10/29/2019 1416   BILIRUBINUR NEGATIVE 10/29/2019 1416   KETONESUR NEGATIVE 10/29/2019 1416   PROTEINUR NEGATIVE 10/29/2019 1416   NITRITE NEGATIVE 10/29/2019 1416   LEUKOCYTESUR NEGATIVE 10/29/2019 1416    FURTHER DISCHARGE INSTRUCTIONS:   Get Medicines reviewed and adjusted: Please take all your medications with you for your next visit with your Primary MD   Laboratory/radiological data: Please request your Primary MD to go over all hospital tests and procedure/radiological results at the follow up, please ask your Primary MD to get all Hospital records sent to his/her office.   In some cases, they will be blood work, cultures and biopsy results pending at the time of your discharge. Please request that your primary care M.D. goes through all the records of your hospital data and follows up on these results.   Also Note the following: If you experience worsening of your admission symptoms, develop shortness of breath, life threatening emergency, suicidal or homicidal thoughts you must seek medical attention immediately by calling 911 or calling your MD immediately  if symptoms less severe.  You must read complete instructions/literature along with all the possible adverse reactions/side effects for all the Medicines you take and that have been prescribed to you. Take any new Medicines after you have completely understood and accpet all the possible adverse reactions/side effects.    Do not drive when taking Pain medications or sleeping medications (Benzodaizepines)   Do not take more than prescribed Pain, Sleep and Anxiety Medications. It is not advisable to combine  anxiety,sleep and pain medications without talking with your primary care practitioner   Special Instructions: If you have smoked or chewed Tobacco  in the last 2 yrs please stop smoking, stop any regular Alcohol  and or any Recreational drug use.   Wear Seat belts while driving.   Please note: You were cared for by a hospitalist during your hospital stay. Once you are discharged, your primary care physician will handle any further medical issues. Please note that NO REFILLS for any discharge medications will be authorized once you are discharged, as it is imperative that you return to your primary care physician (or establish a relationship with a primary care physician if you do not have one) for your post hospital discharge needs so that they can reassess your need for medications and monitor your lab values.  Time coordinating discharge: 35 minutes  SIGNED:  Pamella Pert, MD, PhD 10/30/2019, 4:45 PM

## 2019-10-30 NOTE — Progress Notes (Signed)
Patient and daughter given discharge instructions.  All questions answered.

## 2019-11-01 DIAGNOSIS — I10 Essential (primary) hypertension: Secondary | ICD-10-CM | POA: Diagnosis not present

## 2019-11-01 DIAGNOSIS — E7800 Pure hypercholesterolemia, unspecified: Secondary | ICD-10-CM | POA: Diagnosis not present

## 2019-11-01 DIAGNOSIS — R5381 Other malaise: Secondary | ICD-10-CM | POA: Diagnosis not present

## 2019-11-01 DIAGNOSIS — G459 Transient cerebral ischemic attack, unspecified: Secondary | ICD-10-CM | POA: Diagnosis not present

## 2019-11-01 DIAGNOSIS — R05 Cough: Secondary | ICD-10-CM | POA: Diagnosis not present

## 2019-11-01 DIAGNOSIS — Z8673 Personal history of transient ischemic attack (TIA), and cerebral infarction without residual deficits: Secondary | ICD-10-CM | POA: Diagnosis not present

## 2019-11-01 DIAGNOSIS — I6522 Occlusion and stenosis of left carotid artery: Secondary | ICD-10-CM | POA: Diagnosis not present

## 2019-11-01 DIAGNOSIS — R42 Dizziness and giddiness: Secondary | ICD-10-CM | POA: Diagnosis not present

## 2019-11-01 DIAGNOSIS — Z720 Tobacco use: Secondary | ICD-10-CM | POA: Diagnosis not present

## 2019-11-07 ENCOUNTER — Encounter: Payer: Self-pay | Admitting: Neurology

## 2019-11-07 DIAGNOSIS — Z961 Presence of intraocular lens: Secondary | ICD-10-CM | POA: Diagnosis not present

## 2019-11-07 DIAGNOSIS — H35371 Puckering of macula, right eye: Secondary | ICD-10-CM | POA: Diagnosis not present

## 2019-11-07 DIAGNOSIS — H25012 Cortical age-related cataract, left eye: Secondary | ICD-10-CM | POA: Diagnosis not present

## 2019-11-07 DIAGNOSIS — H2512 Age-related nuclear cataract, left eye: Secondary | ICD-10-CM | POA: Diagnosis not present

## 2019-11-07 DIAGNOSIS — H35033 Hypertensive retinopathy, bilateral: Secondary | ICD-10-CM | POA: Diagnosis not present

## 2019-11-28 ENCOUNTER — Telehealth: Payer: Self-pay

## 2019-11-28 NOTE — Telephone Encounter (Signed)
Telephone call received from patient asking what to expect for appt with Dr. Arbie Cookey on next week. LMOM to return call if still had questions. Kathlen Mody, RN

## 2019-12-03 ENCOUNTER — Ambulatory Visit: Payer: Medicare HMO | Admitting: Vascular Surgery

## 2019-12-03 ENCOUNTER — Other Ambulatory Visit: Payer: Self-pay

## 2019-12-03 ENCOUNTER — Encounter: Payer: Self-pay | Admitting: Vascular Surgery

## 2019-12-03 VITALS — BP 149/84 | HR 80 | Temp 98.2°F | Resp 20 | Ht 62.0 in | Wt 170.1 lb

## 2019-12-03 DIAGNOSIS — I6522 Occlusion and stenosis of left carotid artery: Secondary | ICD-10-CM | POA: Diagnosis not present

## 2019-12-03 NOTE — Progress Notes (Signed)
[facility] [place]  Patient name: Jaclyn Golden MRN: [id number] DOB: 1950/07/28 Sex: female  REASON FOR CONSULT: Evaluation left carotid stenosis  HPI: Jaclyn Golden is a 69 y.o. female, who is here today for discussion of recent finding of left carotid stenosis.  She had a syncopal episode and presented to [facility] hospital on 10/30/2018.  Work-up there included an MRI of her brain which was negative for stroke.  She also had carotid duplex which showed 40 to 59% left internal carotid artery stenosis and 1 to 39% right internal carotid artery stenosis.  She denies any prior focal neurologic deficits or amaurosis fugax or aphasia.  She does have dizziness and what sounds like positional vertigo.  She has a evaluation with neurology scheduled for August.  She denies any cardiac difficulty.  She has no claudication type symptoms.  She is a long-term smoker.  Past Medical History:  Diagnosis Date  . Arthritis    right ankle, wears brace prn  . Hypertension   . Pain in head    occasional pain back of head on right for last couple of years, to see neurlogist for     Family History  Problem Relation Age of Onset  . Stroke Mother   . Anuerysm Brother   . Heart failure Brother   . Cancer Father     SOCIAL HISTORY: Social History   Socioeconomic History  . Marital status: Widowed    Spouse name: Not on file  . Number of children: 3  . Years of education: Not on file  . Highest education level: Not on file  Occupational History  . Not on file  Tobacco Use  . Smoking status: Current Every Day Smoker    Packs/day: 0.25    Years: 47.00    Pack years: 11.75    Types: Cigarettes  . Smokeless tobacco: Never Used  . Tobacco comment: patient is aware she needs to quit   Vaping Use  . Vaping Use: Never used  Substance and Sexual Activity  . Alcohol use: No  . Drug use: No  . Sexual activity: Never  Other Topics Concern    . Not on file  Social History Narrative  . Not on file   Social Determinants of Health   Financial Resource Strain:   . Difficulty of Paying Living Expenses:   Food Insecurity:   . Worried About Programme researcher, broadcasting/film/video in the Last Year:   . Barista in the Last Year:   Transportation Needs:   . Freight forwarder (Medical):   Marland Kitchen Lack of Transportation (Non-Medical):   Physical Activity:   . Days of Exercise per Week:   . Minutes of Exercise per Session:   Stress:   . Feeling of Stress :   Social Connections:   . Frequency of Communication with Friends and Family:   . Frequency of Social Gatherings with Friends and Family:   . Attends Religious Services:   . Active Member of Clubs or Organizations:   . Attends Banker Meetings:   Marland Kitchen Marital Status:   Intimate Partner Violence:   . Fear of Current or Ex-Partner:   . Emotionally Abused:   Marland Kitchen Physically Abused:   . Sexually Abused:     Allergies  Allergen Reactions  . Penicillins Other (See Comments)    Pass out   Has patient had a PCN reaction causing immediate rash, facial/tongue/throat swelling, SOB or lightheadedness  with hypotension:YES Has patient had a PCN reaction causing severe rash involving mucus membranes or skin necrosis:No Has patient had a PCN reaction that required hospitalization:NO Has patient had a PCN reaction occurring within the last 10 years:NO If all of the above answers are "NO", then may proceed with Cephalosporin use.     Current Outpatient Medications  Medication Sig Dispense Refill  . aspirin EC 81 MG tablet Take 1 tablet (81 mg total) by mouth daily. 30 tablet 1  . Cholecalciferol (VITAMIN D3 PO) Take 1 tablet by mouth daily.     Marland Kitchen co-enzyme Q-10 30 MG capsule Take 30 mg by mouth 3 (three) times daily.    Marland Kitchen etodolac (LODINE) 400 MG tablet Take 1 tablet (400 mg total) by mouth 2 (two) times daily as needed. (Patient taking differently: Take 400 mg by mouth 2 (two) times  daily as needed for mild pain or moderate pain. ) 60 tablet 3  . Garlic (GARLIQUE) 400 MG TBEC Take by mouth.    . meclizine (ANTIVERT) 25 MG tablet Take 1 tablet (25 mg total) by mouth 3 (three) times daily as needed for dizziness. 30 tablet 0  . Multiple Vitamins-Minerals (OCUVITE ADULT 50+ PO) Take 1 tablet by mouth daily.     . Omega-3 Fatty Acids (FISH OIL) 1000 MG CAPS Take 1 capsule by mouth daily.    . rosuvastatin (CRESTOR) 5 MG tablet Take 5 mg by mouth every other day.     Marland Kitchen amLODipine (NORVASC) 5 MG tablet Take 5 mg by mouth every evening.  (Patient not taking: Reported on 12/03/2019)    . hydrochlorothiazide (MICROZIDE) 12.5 MG capsule     . HYDROMET 5-1.5 MG/5ML syrup Take 5 mLs by mouth every 6 (six) hours as needed for cough.  (Patient not taking: Reported on 12/03/2019)    . Multiple Vitamins-Minerals (MULTIVITAMIN WITH MINERALS) tablet Take 1 tablet by mouth daily. (Patient not taking: Reported on 12/03/2019)    . predniSONE (DELTASONE) 20 MG tablet Take 20 mg by mouth 2 (two) times daily. 9 day supply (Patient not taking: Reported on 12/03/2019)     No current facility-administered medications for this visit.    REVIEW OF SYSTEMS:  [X]  denotes positive finding, [ ]  denotes negative finding Cardiac  Comments:  Chest pain or chest pressure:    Shortness of breath upon exertion:    Short of breath when lying flat:    Irregular heart rhythm:        Vascular    Pain in calf, thigh, or hip brought on by ambulation:    Pain in feet at night that wakes you up from your sleep:     Blood clot in your veins:    Leg swelling:         Pulmonary    Oxygen at home:    Productive cough:     Wheezing:         Neurologic    Sudden weakness in arms or legs:     Sudden numbness in arms or legs:     Sudden onset of difficulty speaking or slurred speech:    Temporary loss of vision in one eye:     Problems with dizziness:  x       Gastrointestinal    Blood in stool:     Vomited  blood:         Genitourinary    Burning when urinating:     Blood in urine:  Psychiatric    Major depression:         Hematologic    Bleeding problems:    Problems with blood clotting too easily:        Skin    Rashes or ulcers:        Constitutional    Fever or chills:      PHYSICAL EXAM: Vitals:   12/03/19 0935 12/03/19 0937  BP: 138/84 (!) 149/84  Pulse: 80   Resp: 20   Temp: 98.2 F (36.8 C)   TempSrc: Temporal   SpO2: 95%   Weight: 170 lb 1.6 oz (77.2 kg)   Height: 5\' 2"  (1.575 m)     GENERAL: The patient is a well-nourished female, in no acute distress. The vital signs are documented above. CARDIOVASCULAR: I do not hear carotid bruits bilaterally.  She has 2+ radial and 2+ dorsalis pedis pulses bilaterally PULMONARY: There is good air exchange  ABDOMEN: Soft and non-tender  MUSCULOSKELETAL: There are no major deformities or cyanosis. NEUROLOGIC: No focal weakness or paresthesias are detected. SKIN: There are no ulcers or rashes noted. PSYCHIATRIC: The patient has a normal affect.  DATA:  I reviewed her carotid duplex from 10/30/2019 showing moderate 40 to 59% left internal carotid artery stenosis and less than 39% stenosis on the right.  MEDICAL ISSUES: Had a long discussion with the patient regarding this.  I did explain the relationship between cigarette smoking and her stenosis.  I explained the critical importance of smoking cessation.  I did review symptoms of carotid disease and she knows to report immediately to the emergency room should this occur.  I have recommended serial carotid duplex follow-up to rule out any asymptomatic progression.  We will see her again in 1 year with bilateral carotid duplex.   Rosetta Posner, MD FACS [facility] Tel [phone] Pager [phone]

## 2020-01-07 DIAGNOSIS — M5416 Radiculopathy, lumbar region: Secondary | ICD-10-CM | POA: Diagnosis not present

## 2020-01-07 DIAGNOSIS — M79661 Pain in right lower leg: Secondary | ICD-10-CM | POA: Diagnosis not present

## 2020-01-09 DIAGNOSIS — I6522 Occlusion and stenosis of left carotid artery: Secondary | ICD-10-CM | POA: Diagnosis not present

## 2020-01-09 DIAGNOSIS — Z8673 Personal history of transient ischemic attack (TIA), and cerebral infarction without residual deficits: Secondary | ICD-10-CM | POA: Diagnosis not present

## 2020-01-09 DIAGNOSIS — G4700 Insomnia, unspecified: Secondary | ICD-10-CM | POA: Diagnosis not present

## 2020-01-09 DIAGNOSIS — Z122 Encounter for screening for malignant neoplasm of respiratory organs: Secondary | ICD-10-CM | POA: Diagnosis not present

## 2020-01-09 DIAGNOSIS — M85852 Other specified disorders of bone density and structure, left thigh: Secondary | ICD-10-CM | POA: Diagnosis not present

## 2020-01-09 DIAGNOSIS — I10 Essential (primary) hypertension: Secondary | ICD-10-CM | POA: Diagnosis not present

## 2020-01-09 DIAGNOSIS — E559 Vitamin D deficiency, unspecified: Secondary | ICD-10-CM | POA: Diagnosis not present

## 2020-01-09 DIAGNOSIS — Z0000 Encounter for general adult medical examination without abnormal findings: Secondary | ICD-10-CM | POA: Diagnosis not present

## 2020-01-09 DIAGNOSIS — R05 Cough: Secondary | ICD-10-CM | POA: Diagnosis not present

## 2020-01-09 DIAGNOSIS — M79661 Pain in right lower leg: Secondary | ICD-10-CM | POA: Diagnosis not present

## 2020-01-09 DIAGNOSIS — Z1389 Encounter for screening for other disorder: Secondary | ICD-10-CM | POA: Diagnosis not present

## 2020-01-09 DIAGNOSIS — E7800 Pure hypercholesterolemia, unspecified: Secondary | ICD-10-CM | POA: Diagnosis not present

## 2020-01-16 DIAGNOSIS — M79661 Pain in right lower leg: Secondary | ICD-10-CM | POA: Diagnosis not present

## 2020-01-16 DIAGNOSIS — M5416 Radiculopathy, lumbar region: Secondary | ICD-10-CM | POA: Diagnosis not present

## 2020-01-21 DIAGNOSIS — M5416 Radiculopathy, lumbar region: Secondary | ICD-10-CM | POA: Diagnosis not present

## 2020-01-21 DIAGNOSIS — M79661 Pain in right lower leg: Secondary | ICD-10-CM | POA: Diagnosis not present

## 2020-01-23 DIAGNOSIS — M5416 Radiculopathy, lumbar region: Secondary | ICD-10-CM | POA: Diagnosis not present

## 2020-01-23 DIAGNOSIS — M79661 Pain in right lower leg: Secondary | ICD-10-CM | POA: Diagnosis not present

## 2020-02-03 DIAGNOSIS — M79661 Pain in right lower leg: Secondary | ICD-10-CM | POA: Diagnosis not present

## 2020-02-03 DIAGNOSIS — M5416 Radiculopathy, lumbar region: Secondary | ICD-10-CM | POA: Diagnosis not present

## 2020-02-03 NOTE — Progress Notes (Signed)
NEUROLOGY CONSULTATION NOTE  Jaclyn Golden MRN: [id number] DOB: 1951/05/14  Referring provider: Tally Joe, MD Primary care provider: Tally Joe, MD  Reason for consult:  TIA  HISTORY OF PRESENT ILLNESS: Jaclyn Golden is a 69 year old right-handed female with HTN who presents for TIA.  History supplemented by hospital and referring provider's notes.  She reports history of vertigo, described as spinning that is triggered by turning around or rolling over in bed, usually lasting a minute or so.  She was admitted to [facility] on 10/29/2019 for syncope.  She was standing in her kitchen when she turned around and she suddenly developed vertigo.  She sat down and lost consciousness in the chair for about 5 minutes.  She did not have any witness convulsions, tongue biting or incontinence.  She reportedly did not have a facial droop or slurred speech.  When she woke up, she was confused for a moment.  No unilateral weakness or numbness.  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  Carotid doppler showed 40-59% left ICA stenosis and 1-39% right ICA stenosis.  2D Echocardiogram showed EF 60-65% with no cardiac source of embolus.  Telemetry demonstrated no significant arrhythmia.  EEG was normal.  It was believed that she may have had a TIA and was started on ASA.  She was LDL from the previous week was 195.  She was continued on Crestor 5mg  daily.  She followed up with vascular surgery who recommended monitoring of the left ICA.    PAST MEDICAL HISTORY: Past Medical History:  Diagnosis Date  . Arthritis    right ankle, wears brace prn  . Hypertension   . Pain in head    occasional pain back of head on right for last couple of years, to see neurlogist for     PAST SURGICAL HISTORY: Past Surgical History:  Procedure  Laterality Date  . APPENDECTOMY  1980's   ruptured  . BREAST BIOPSY Left 1980  . CATARACT EXTRACTION    . COLONOSCOPY WITH PROPOFOL N/A 02/11/2014   Procedure: COLONOSCOPY WITH PROPOFOL;  Surgeon: 02/13/2014, MD;  Location: [facility] ENDOSCOPY;  Service: Endoscopy;  Laterality: N/A;  . EYE SURGERY    . TUBAL LIGATION  1979    MEDICATIONS: Current Outpatient Medications on File Prior to Visit  Medication Sig Dispense Refill  . amLODipine (NORVASC) 5 MG tablet Take 5 mg by mouth every evening.  (Patient not taking: Reported on 12/03/2019)    . aspirin EC 81 MG tablet Take 1 tablet (81 mg total) by mouth daily. 30 tablet 1  . Cholecalciferol (VITAMIN D3 PO) Take 1 tablet by mouth daily.     12/05/2019 co-enzyme Q-10 30 MG capsule Take 30 mg by mouth 3 (three) times daily.    Marland Kitchen etodolac (LODINE) 400 MG tablet Take 1 tablet (400 mg total) by mouth 2 (two) times daily as needed. (Patient taking differently: Take 400 mg by mouth 2 (two) times daily as needed for mild pain or moderate pain. ) 60 tablet 3  . Garlic (GARLIQUE) 400 MG TBEC Take by mouth.    . hydrochlorothiazide (MICROZIDE) 12.5 MG capsule     . HYDROMET 5-1.5 MG/5ML syrup Take 5 mLs by mouth every 6 (six) hours as needed for cough.  (Patient not taking: Reported on 12/03/2019)    .  meclizine (ANTIVERT) 25 MG tablet Take 1 tablet (25 mg total) by mouth 3 (three) times daily as needed for dizziness. 30 tablet 0  . Multiple Vitamins-Minerals (MULTIVITAMIN WITH MINERALS) tablet Take 1 tablet by mouth daily. (Patient not taking: Reported on 12/03/2019)    . Multiple Vitamins-Minerals (OCUVITE ADULT 50+ PO) Take 1 tablet by mouth daily.     . Omega-3 Fatty Acids (FISH OIL) 1000 MG CAPS Take 1 capsule by mouth daily.    . predniSONE (DELTASONE) 20 MG tablet Take 20 mg by mouth 2 (two) times daily. 9 day supply (Patient not taking: Reported on 12/03/2019)    . rosuvastatin (CRESTOR) 5 MG tablet Take 5 mg by mouth every other day.      No current  facility-administered medications on file prior to visit.    ALLERGIES: Allergies  Allergen Reactions  . Penicillins Other (See Comments)    Pass out   Has patient had a PCN reaction causing immediate rash, facial/tongue/throat swelling, SOB or lightheadedness with hypotension:YES Has patient had a PCN reaction causing severe rash involving mucus membranes or skin necrosis:No Has patient had a PCN reaction that required hospitalization:NO Has patient had a PCN reaction occurring within the last 10 years:NO If all of the above answers are "NO", then may proceed with Cephalosporin use.     FAMILY HISTORY: Family History  Problem Relation Age of Onset  . Stroke Mother   . Anuerysm Brother   . Heart failure Brother   . Cancer Father     SOCIAL HISTORY: Social History   Socioeconomic History  . Marital status: Widowed    Spouse name: Not on file  . Number of children: 3  . Years of education: Not on file  . Highest education level: Not on file  Occupational History  . Not on file  Tobacco Use  . Smoking status: Current Every Day Smoker    Packs/day: 0.25    Years: 47.00    Pack years: 11.75    Types: Cigarettes  . Smokeless tobacco: Never Used  . Tobacco comment: patient is aware she needs to quit   Vaping Use  . Vaping Use: Never used  Substance and Sexual Activity  . Alcohol use: No  . Drug use: No  . Sexual activity: Never  Other Topics Concern  . Not on file  Social History Narrative  . Not on file   Social Determinants of Health   Financial Resource Strain:   . Difficulty of Paying Living Expenses:   Food Insecurity:   . Worried About Programme researcher, broadcasting/film/video in the Last Year:   . Barista in the Last Year:   Transportation Needs:   . Freight forwarder (Medical):   Marland Kitchen Lack of Transportation (Non-Medical):   Physical Activity:   . Days of Exercise per Week:   . Minutes of Exercise per Session:   Stress:   . Feeling of Stress :   Social  Connections:   . Frequency of Communication with Friends and Family:   . Frequency of Social Gatherings with Friends and Family:   . Attends Religious Services:   . Active Member of Clubs or Organizations:   . Attends Banker Meetings:   Marland Kitchen Marital Status:   Intimate Partner Violence:   . Fear of Current or Ex-Partner:   . Emotionally Abused:   Marland Kitchen Physically Abused:   . Sexually Abused:     REVIEW OF SYSTEMS: Constitutional: No fevers, chills, or  sweats, no generalized fatigue, change in appetite Eyes: No visual changes, double vision, eye pain Ear, nose and throat: No hearing loss, ear pain, nasal congestion, sore throat Cardiovascular: No chest pain, palpitations Respiratory:  No shortness of breath at rest or with exertion, wheezes GastrointestinaI: No nausea, vomiting, diarrhea, abdominal pain, fecal incontinence Genitourinary:  No dysuria, urinary retention or frequency Musculoskeletal:  No neck pain, back pain Integumentary: No rash, pruritus, skin lesions Neurological: as above Psychiatric: No depression, insomnia, anxiety Endocrine: No palpitations, fatigue, diaphoresis, mood swings, change in appetite, change in weight, increased thirst Hematologic/Lymphatic:  No purpura, petechiae. Allergic/Immunologic: no itchy/runny eyes, nasal congestion, recent allergic reactions, rashes  PHYSICAL EXAM: Blood pressure (!) 163/88, pulse 91, height 5\' 2"  (1.575 m), weight 171 lb (77.6 kg), SpO2 96 %. General: No acute distress.  Patient appears well-groomed.  Head:  Normocephalic/atraumatic Eyes:  fundi examined but not visualized Neck: supple, no paraspinal tenderness, full range of motion Back: No paraspinal tenderness Heart: regular rate and rhythm Lungs: Clear to auscultation bilaterally. Vascular: No carotid bruits. Neurological Exam: Mental status: alert and oriented to person, place, and time, recent and remote memory intact, fund of knowledge intact, attention  and concentration intact, speech fluent and not dysarthric, language intact. Cranial nerves: CN I: not tested CN II: pupils equal, round and reactive to light, visual fields intact CN III, IV, VI:  full range of motion, no nystagmus, no ptosis CN V: facial sensation intact CN VII: upper and lower face symmetric CN VIII: hearing intact CN IX, X: gag intact, uvula midline CN XI: sternocleidomastoid and trapezius muscles intact CN XII: tongue midline Bulk & Tone: normal, no fasciculations. Motor:  5/5 throughout  Sensation:  Pinprick and vibration sensation intact. Deep Tendon Reflexes:  2+ throughout, toes downgoing.  Finger to nose testing:  Without dysmetria.  Heel to shin:  Without dysmetria.  Gait:  Narrow-based.  Steady.  Romberg negative.  IMPRESSION: 1.  I suspect that she had an episode of BPPV followed by vasovagal syncope.  I do not think she had a TIA.  In absence of acute stroke on MRI and no focal or lateralizing clinical symptoms, I cannot say this was a cerebral ischemic attack. 2.  Left ICA stenosis 3.  Elevated blood pressure 4.  Tobacco use disorder.  PLAN: Smoking cessation Annual carotid as recommended by vascular surgery Follow up with PCP regarding blood pressure Otherwise, no further recommendations.  Thank you for allowing me to take part in the care of this patient.  [place], DO  CC: Shon Millet, MD

## 2020-02-04 ENCOUNTER — Other Ambulatory Visit: Payer: Self-pay

## 2020-02-04 ENCOUNTER — Telehealth: Payer: Self-pay

## 2020-02-04 ENCOUNTER — Encounter: Payer: Self-pay | Admitting: Neurology

## 2020-02-04 ENCOUNTER — Ambulatory Visit: Payer: Medicare HMO | Admitting: Neurology

## 2020-02-04 VITALS — BP 163/88 | HR 91 | Ht 62.0 in | Wt 171.0 lb

## 2020-02-04 DIAGNOSIS — H8110 Benign paroxysmal vertigo, unspecified ear: Secondary | ICD-10-CM | POA: Diagnosis not present

## 2020-02-04 DIAGNOSIS — F17200 Nicotine dependence, unspecified, uncomplicated: Secondary | ICD-10-CM | POA: Diagnosis not present

## 2020-02-04 DIAGNOSIS — R55 Syncope and collapse: Secondary | ICD-10-CM | POA: Diagnosis not present

## 2020-02-04 DIAGNOSIS — I6522 Occlusion and stenosis of left carotid artery: Secondary | ICD-10-CM | POA: Diagnosis not present

## 2020-02-04 NOTE — Telephone Encounter (Signed)
Prior to patient leaving her appointment she wanted me to let Dr. Everlena Cooper know that before she passed out she could not stop yawning. Patient isn't sure if this is important or not but wanted Dr. Everlena Cooper to know about it.

## 2020-02-04 NOTE — Telephone Encounter (Signed)
Noted  

## 2020-02-04 NOTE — Patient Instructions (Signed)
I don't think you had a transient ischemic attack I think you had another episode of vertigo that caused you to be overwhelmed and then your blood pressure dropped, causing you to pass out (vasovagal syncope)

## 2020-02-27 ENCOUNTER — Other Ambulatory Visit (INDEPENDENT_AMBULATORY_CARE_PROVIDER_SITE_OTHER): Payer: Self-pay | Admitting: Family Medicine

## 2020-03-19 DIAGNOSIS — M5136 Other intervertebral disc degeneration, lumbar region: Secondary | ICD-10-CM | POA: Diagnosis not present

## 2020-05-28 ENCOUNTER — Other Ambulatory Visit (INDEPENDENT_AMBULATORY_CARE_PROVIDER_SITE_OTHER): Payer: Self-pay | Admitting: Family Medicine

## 2020-06-10 IMAGING — MR MR HEAD W/O CM
10 of 11 series · 39 of 48 positions shown · non-contrast
Comparison: Head CT 10/29/2019.

CLINICAL DATA: Syncope.  Dizzy episode.

EXAM:
MRI HEAD WITHOUT CONTRAST
TECHNIQUE: Multiplanar, multiecho pulse sequences of the brain and surrounding
structures were obtained without intravenous contrast.

[Series 5: DWI · axial · 3.0mm · 1.36mm/px · z∈[-15,+124]mm · 8 of 96 slices shown (1 of 4)]
[im 1/96]
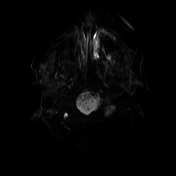
[im 14/96]
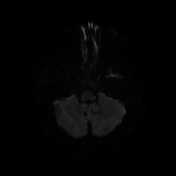
[im 28/96]
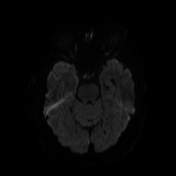
[im 41/96]
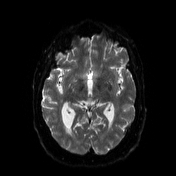
[im 55/96]
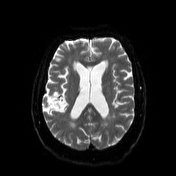
[im 68/96]
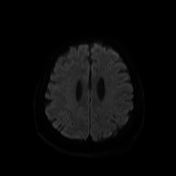
[im 82/96]
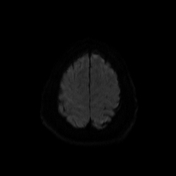
[im 96/96]
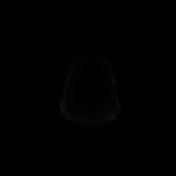

[Series 6: DWI · axial · 3.0mm · 1.36mm/px · z∈[-15,+124]mm · 4 of 48 slices shown (2 of 4)]
[im 1/48]
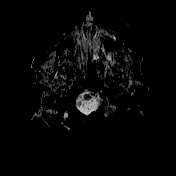
[im 16/48]
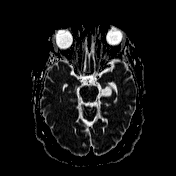
[im 32/48]
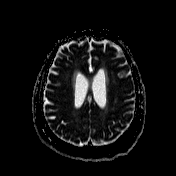
[im 48/48]
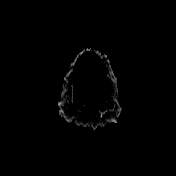

[Series 7: T1 · sagittal · 5.0mm · 0.75mm/px · 2 of 24 slices shown (1 of 2)]
[im 1/24]
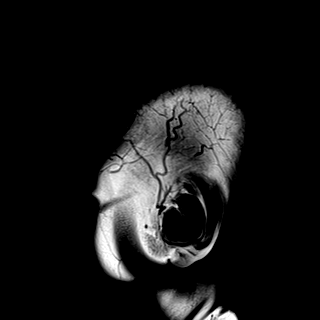
[im 24/24]
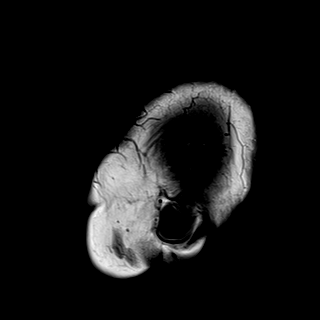

[Series 8: T2 · axial · 5.0mm · 0.62mm/px · z∈[-28,+120]mm · 2 of 24 slices shown (1 of 2)]
[im 1/24]
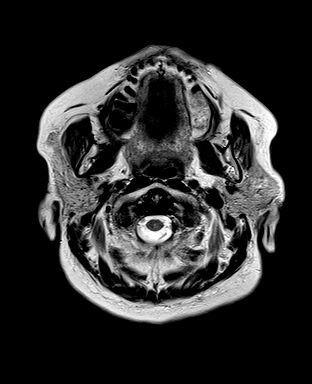
[im 24/24]
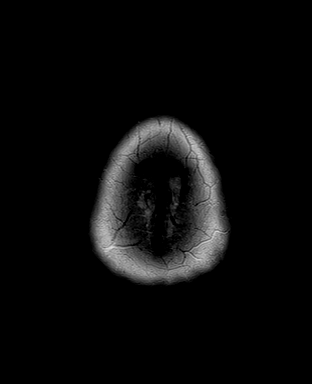

[Series 9: GRE · axial · 3.0mm · 0.45mm/px · z∈[-24,+121]mm · 4 of 50 slices shown]
[im 1/50]
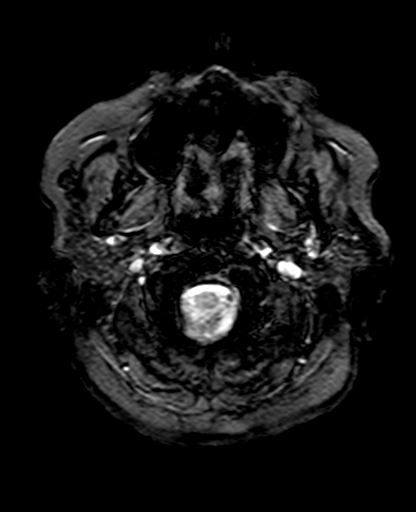
[im 17/50]
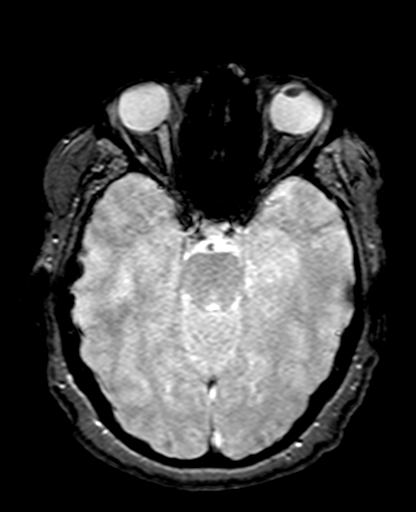
[im 33/50]
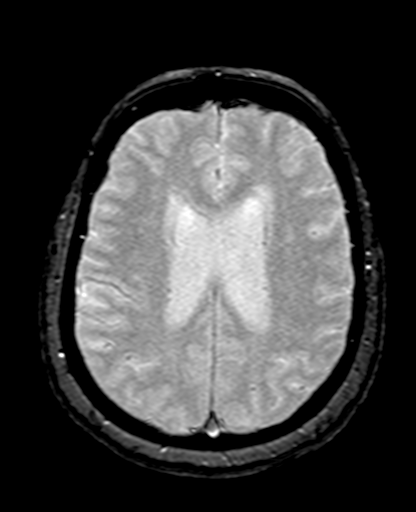
[im 50/50]
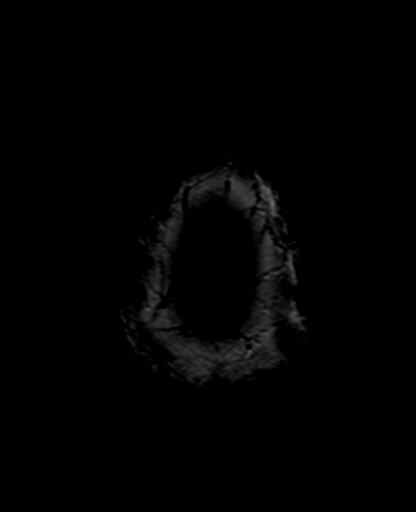

[Series 10: FLAIR · axial · 3.0mm · 0.86mm/px · z∈[-26,+122]mm · 4 of 51 slices shown]
[im 1/51]
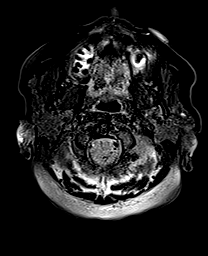
[im 17/51]
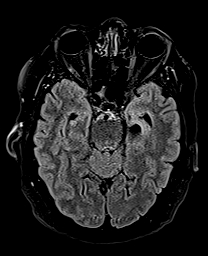
[im 34/51]
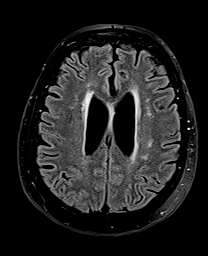
[im 51/51]
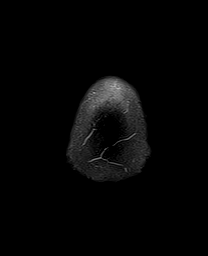

[Series 11: T1 · axial · 3.0mm · 0.45mm/px · z∈[-27,+118]mm · 4 of 50 slices shown (2 of 2)]
[im 1/50]
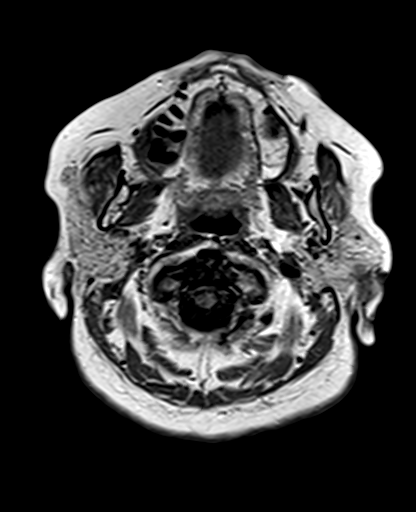
[im 17/50]
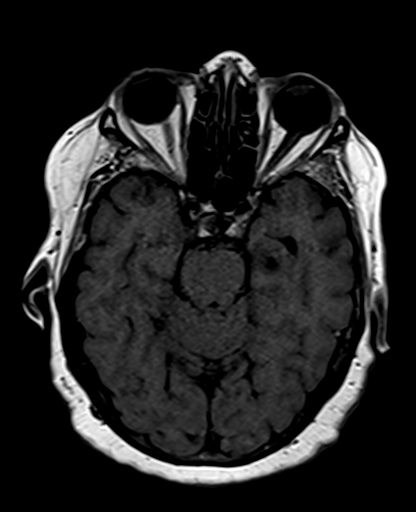
[im 33/50]
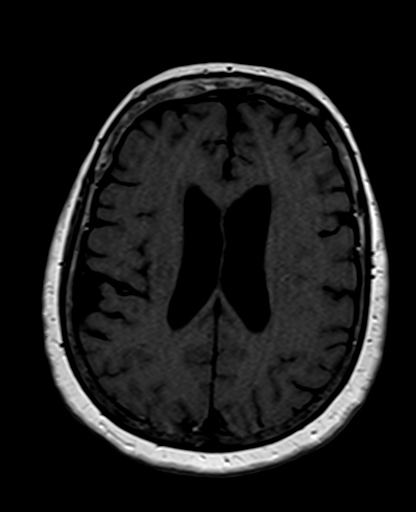
[im 50/50]
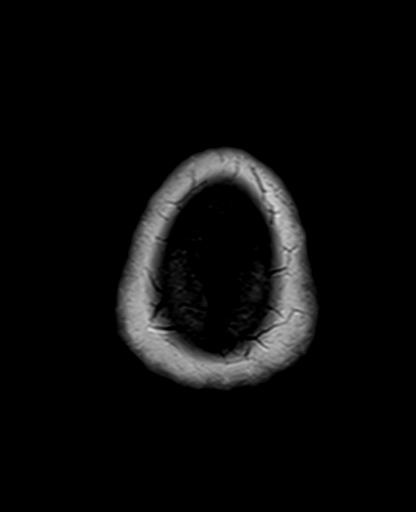

[Series 12: DWI · coronal · 5.0mm · 1.31mm/px · 5 of 64 slices shown (3 of 4)]
[im 1/64]
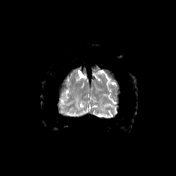
[im 16/64]
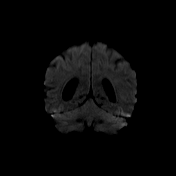
[im 32/64]
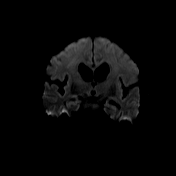
[im 48/64]
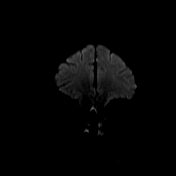
[im 64/64]
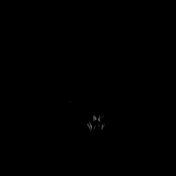

[Series 13: DWI · coronal · 5.0mm · 1.31mm/px · 3 of 32 slices shown (4 of 4)]
[im 1/32]
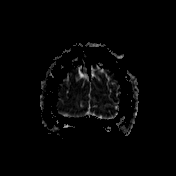
[im 16/32]
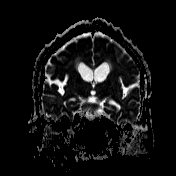
[im 32/32]
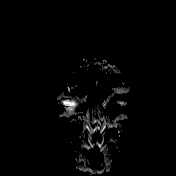

[Series 14: T2 · coronal · 5.0mm · 0.69mm/px · 3 of 32 slices shown (2 of 2)]
[im 1/32]
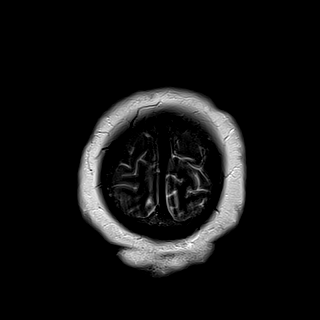
[im 16/32]
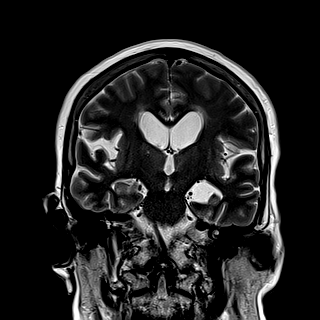
[im 32/32]
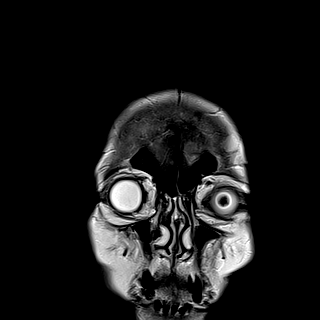

[39 of 48 positions shown; findings below may reference images not displayed]

FINDINGS: Brain: Diffusion imaging does not show any acute or subacute
infarction. There are several old small vessel cerebellar
infarctions. The brainstem is normal. Cerebral hemispheres show mild
to moderate chronic small-vessel ischemic changes of the deep white
matter considering age. No cortical or large vessel territory
infarction. No mass lesion, hemorrhage, hydrocephalus or extra-axial
collection. Incidental cyst of the choroid fissure on the left.

Vascular: Major vessels at the base of the brain show flow.

Skull and upper cervical spine: Negative

Sinuses/Orbits: Clear/normal.  Previous lens implant on the right.

Other: None
IMPRESSION: No acute or subacute finding to explain the clinical presentation.
Several old small vessel cerebellar infarctions. Mild to moderate
chronic small-vessel ischemic change of the cerebral hemispheric
white matter.

## 2020-07-10 DIAGNOSIS — Z8673 Personal history of transient ischemic attack (TIA), and cerebral infarction without residual deficits: Secondary | ICD-10-CM | POA: Diagnosis not present

## 2020-07-10 DIAGNOSIS — I10 Essential (primary) hypertension: Secondary | ICD-10-CM | POA: Diagnosis not present

## 2020-07-10 DIAGNOSIS — G4700 Insomnia, unspecified: Secondary | ICD-10-CM | POA: Diagnosis not present

## 2020-07-10 DIAGNOSIS — I700 Atherosclerosis of aorta: Secondary | ICD-10-CM | POA: Diagnosis not present

## 2020-07-10 DIAGNOSIS — E559 Vitamin D deficiency, unspecified: Secondary | ICD-10-CM | POA: Diagnosis not present

## 2020-07-10 DIAGNOSIS — E7800 Pure hypercholesterolemia, unspecified: Secondary | ICD-10-CM | POA: Diagnosis not present

## 2020-07-10 DIAGNOSIS — F439 Reaction to severe stress, unspecified: Secondary | ICD-10-CM | POA: Diagnosis not present

## 2020-07-10 DIAGNOSIS — I6522 Occlusion and stenosis of left carotid artery: Secondary | ICD-10-CM | POA: Diagnosis not present

## 2020-07-10 DIAGNOSIS — M85852 Other specified disorders of bone density and structure, left thigh: Secondary | ICD-10-CM | POA: Diagnosis not present

## 2020-10-01 ENCOUNTER — Other Ambulatory Visit: Payer: Self-pay

## 2020-10-01 ENCOUNTER — Emergency Department (HOSPITAL_COMMUNITY)
Admission: EM | Admit: 2020-10-01 | Discharge: 2020-10-01 | Disposition: A | Discharge status: Home / Self Care | Payer: Medicare HMO | Attending: Emergency Medicine | Admitting: Emergency Medicine

## 2020-10-01 DIAGNOSIS — Z79899 Other long term (current) drug therapy: Secondary | ICD-10-CM | POA: Diagnosis not present

## 2020-10-01 DIAGNOSIS — Z7982 Long term (current) use of aspirin: Secondary | ICD-10-CM | POA: Insufficient documentation

## 2020-10-01 DIAGNOSIS — R067 Sneezing: Secondary | ICD-10-CM | POA: Diagnosis not present

## 2020-10-01 DIAGNOSIS — R4020 Unspecified coma: Secondary | ICD-10-CM | POA: Diagnosis not present

## 2020-10-01 DIAGNOSIS — R0981 Nasal congestion: Secondary | ICD-10-CM | POA: Diagnosis not present

## 2020-10-01 DIAGNOSIS — I10 Essential (primary) hypertension: Secondary | ICD-10-CM | POA: Insufficient documentation

## 2020-10-01 DIAGNOSIS — Z20822 Contact with and (suspected) exposure to covid-19: Secondary | ICD-10-CM | POA: Diagnosis not present

## 2020-10-01 DIAGNOSIS — R059 Cough, unspecified: Secondary | ICD-10-CM | POA: Diagnosis not present

## 2020-10-01 DIAGNOSIS — R5383 Other fatigue: Secondary | ICD-10-CM | POA: Insufficient documentation

## 2020-10-01 DIAGNOSIS — R55 Syncope and collapse: Secondary | ICD-10-CM | POA: Diagnosis not present

## 2020-10-01 DIAGNOSIS — F17210 Nicotine dependence, cigarettes, uncomplicated: Secondary | ICD-10-CM | POA: Diagnosis not present

## 2020-10-01 DIAGNOSIS — I959 Hypotension, unspecified: Secondary | ICD-10-CM | POA: Diagnosis not present

## 2020-10-01 DIAGNOSIS — R0902 Hypoxemia: Secondary | ICD-10-CM | POA: Diagnosis not present

## 2020-10-01 DIAGNOSIS — R531 Weakness: Secondary | ICD-10-CM | POA: Diagnosis not present

## 2020-10-01 LAB — I-STAT CHEM 8, ED
BUN: 17 mg/dL (ref 8–23)
Calcium, Ion: 1.11 mmol/L — ABNORMAL LOW (ref 1.15–1.40)
Chloride: 97 mmol/L — ABNORMAL LOW (ref 98–111)
Creatinine, Ser: 0.8 mg/dL (ref 0.44–1.00)
Glucose, Bld: 97 mg/dL (ref 70–99)
HCT: 38 % (ref 36.0–46.0)
Hemoglobin: 12.9 g/dL (ref 12.0–15.0)
Potassium: 4.4 mmol/L (ref 3.5–5.1)
Sodium: 132 mmol/L — ABNORMAL LOW (ref 135–145)
TCO2: 27 mmol/L (ref 22–32)

## 2020-10-01 LAB — SARS CORONAVIRUS 2 (TAT 6-24 HRS): SARS Coronavirus 2: NEGATIVE

## 2020-10-01 NOTE — ED Triage Notes (Signed)
Patient comes in via [facility] for syncopal episode that lasted for 15 minutes family reports. Family states she was snoring but did not fall out of the chair.  States that she has had nasal congestion and family gave her nyquil. Patient is alert and oriented at this time.   BP:130/68 66 HR 96 %  131 CBG

## 2020-10-01 NOTE — ED Provider Notes (Signed)
Blood pressure (!) 144/77, pulse 65, temperature 98.4 F (36.9 C), temperature source Oral, resp. rate 16, height 5\' 2"  (1.575 m), weight 79.8 kg, SpO2 97 %.  Assuming care from , PA-C.  In short, Jaclyn Golden is a 70 y.o. female with a chief complaint of No chief complaint on file. 61  Refer to the original H&P for additional details.  The current plan of care is to follow labs and reassess.  04:00 PM  Patient's labs reviewed showing no acute findings.  Sending off Covid test which she will follow in the MyChart app.  She is feeling well.  She has had recent evaluation for syncope and peripheral vertigo with no acute findings.    Marland Kitchen, MD 10/01/20 [phone]

## 2020-10-01 NOTE — ED Provider Notes (Signed)
[facility] EMERGENCY DEPARTMENT Provider Note   CSN: [id number] Arrival date & time: 10/01/20  1345     History No chief complaint on file.   Jaclyn Golden is a 70 y.o. female.  The history is provided by the patient and medical records. No language interpreter was used.     70 year old female significant history of hypertension, history of syncope, brought here via EMS from home for evaluation of syncope.  Patient report a week ago her son-in-law was sick with cold symptoms.  Several days ago her daughter also sick with similar symptoms.  Yesterday patient also developed similar symptoms which includes sinus congestion, sneezing, coughing, generalized fatigue, and chest congestion. This morning, she went downstairs to eat, but states she was tired, and well sitting on a chair she had an apparent syncopal episode lasting for 10 to 15 minutes according to family member.  States symptoms were very similar to recent syncope that she was thoroughly work-up several months ago without any definitive diagnosis.  She also admits to taking several over-the-counter medication since yesterday for her symptoms.  Medication include Mucinex, and NyQuil.  At this time she denies having headache, chest pain, heart palpitation, focal numbness or focal weakness.  She did report feeling hot prior to her syncopal episode earlier this morning.  She is now at her baseline.  Patient also mention her son-in-law and her daughter has been tested negative for COVID-19.  She is fully vaccinated  Past Medical History:  Diagnosis Date  . Arthritis    right ankle, wears brace prn  . Hypertension   . Pain in head    occasional pain back of head on right for last couple of years, to see neurlogist for     Patient Active Problem List   Diagnosis Date Noted  . Syncope 10/29/2019  . Tobacco dependence 10/29/2019  . Essential (primary) hypertension 07/09/2018  . HLD (hyperlipidemia) 07/09/2018  .  Status post cataract extraction and insertion of intraocular lens of right eye 07/09/2018  . Hyperglycemia 11/24/1967    Past Surgical History:  Procedure Laterality Date  . APPENDECTOMY  1980's   ruptured  . BREAST BIOPSY Left 1980  . CATARACT EXTRACTION    . COLONOSCOPY WITH PROPOFOL N/A 02/11/2014   Procedure: COLONOSCOPY WITH PROPOFOL;  Surgeon: Charolett Bumpers, MD;  Location: [facility] ENDOSCOPY;  Service: Endoscopy;  Laterality: N/A;  . EYE SURGERY    . TUBAL LIGATION  1979     OB History   No obstetric history on file.     Family History  Problem Relation Age of Onset  . Stroke Mother   . Anuerysm Brother   . Heart failure Brother   . Cancer Father     Social History   Tobacco Use  . Smoking status: Current Every Day Smoker    Packs/day: 0.25    Years: 47.00    Pack years: 11.75    Types: Cigarettes  . Smokeless tobacco: Never Used  . Tobacco comment: patient is aware she needs to quit   Vaping Use  . Vaping Use: Never used  Substance Use Topics  . Alcohol use: No  . Drug use: No    Home Medications Prior to Admission medications   Medication Sig Start Date End Date Taking? Authorizing Provider  aspirin EC 81 MG tablet Take 1 tablet (81 mg total) by mouth daily. 10/30/19 10/29/20  Leatha Gilding, MD  Cholecalciferol (VITAMIN D3 PO) Take 1 tablet by mouth  daily.     [provider]  co-enzyme Q-10 30 MG capsule Take 30 mg by mouth 3 (three) times daily.    [provider]  etodolac (LODINE) 400 MG tablet TAKE 1 TABLET(400 MG) BY MOUTH TWICE DAILY AS NEEDED 05/28/20   Hilts, Casimiro Needle, MD  Garlic (GARLIQUE) 400 MG TBEC Take by mouth.    [provider]  hydrochlorothiazide (MICROZIDE) 12.5 MG capsule  11/18/19   [provider]  meclizine (ANTIVERT) 25 MG tablet Take 1 tablet (25 mg total) by mouth 3 (three) times daily as needed for dizziness. 01/15/19   Devoria Albe, MD  Multiple Vitamins-Minerals (MULTIVITAMIN WITH MINERALS)  tablet Take 1 tablet by mouth daily.     [provider]  Multiple Vitamins-Minerals (OCUVITE ADULT 50+ PO) Take 1 tablet by mouth daily.     [provider]  Omega-3 Fatty Acids (FISH OIL) 1000 MG CAPS Take 1 capsule by mouth daily.    [provider]  rosuvastatin (CRESTOR) 5 MG tablet Take 5 mg by mouth every other day.  09/06/19   [provider]    Allergies    Penicillins  Review of Systems   Review of Systems  All other systems reviewed and are negative.   Physical Exam Updated Vital Signs BP 130/60 (BP Location: Right Arm)   Pulse 72   Temp 98.4 F (36.9 C) (Oral)   Resp 16   Ht 5\' 2"  (1.575 m)   Wt 79.8 kg   SpO2 96%   BMI 32.19 kg/m   Physical Exam Vitals and nursing note reviewed.  Constitutional:      General: She is not in acute distress.    Appearance: She is well-developed.  HENT:     Head: Atraumatic.     Mouth/Throat:     Mouth: Mucous membranes are moist.  Eyes:     Extraocular Movements: Extraocular movements intact.     Conjunctiva/sclera: Conjunctivae normal.     Pupils: Pupils are equal, round, and reactive to light.  Cardiovascular:     Rate and Rhythm: Normal rate and regular rhythm.     Pulses: Normal pulses.     Heart sounds: Normal heart sounds.  Pulmonary:     Effort: Pulmonary effort is normal.     Breath sounds: Normal breath sounds.  Abdominal:     Palpations: Abdomen is soft.  Musculoskeletal:     Cervical back: Neck supple.  Skin:    Findings: No rash.  Neurological:     Mental Status: She is alert and oriented to person, place, and time.     GCS: GCS eye subscore is 4. GCS verbal subscore is 5. GCS motor subscore is 6.     Cranial Nerves: Cranial nerves are intact.     Sensory: Sensation is intact.     Motor: Motor function is intact.     ED Results / Procedures / Treatments   Labs (all labs ordered are listed, but only abnormal results are displayed) Labs Reviewed - No data to  display  EKG None   Date: 10/01/2020  Rate: 68  Rhythm: normal sinus rhythm  QRS Axis: normal  Intervals: normal  ST/T Wave abnormalities: normal  Conduction Disutrbances: none  Narrative Interpretation:   Old EKG Reviewed: No significant changes noted     Radiology No results found.  Procedures Procedures   Medications Ordered in ED Medications - No data to display  ED Course  I have reviewed the triage vital signs  and the nursing notes.  Pertinent labs & imaging results that were available during my care of the patient were reviewed by me and considered in my medical decision making (see chart for details).    MDM Rules/Calculators/A&P                          BP (!) 144/77 (BP Location: Right Arm)   Pulse 65   Temp 98.4 F (36.9 C) (Oral)   Resp 16   Ht 5\' 2"  (1.575 m)   Wt 79.8 kg   SpO2 97%   BMI 32.19 kg/m   Final Clinical Impression(s) / ED Diagnoses Final diagnoses:  Vasovagal syncope    Rx / DC Orders ED Discharge Orders    None     2:11 PM Patient report having a syncopal episode earlier today.  Also complaining of cold symptoms for the past 2 days.  Similar sickness were noted in several other family members within the past week.  Patient has been evaluated for this similar syncope several months prior.  Extensive work-up was obtained and she did follow-up with neurology.  It was felt that her symptoms likely secondary to BPPV followed by a vasovagal syncope.  3:16 PM EKG today is normal.  Currently awaits labs and orthostatic vital sign.  If negative, pt can be discharge.  Covid test obtained and will not likely result during this visit.  Care sign out to oncoming team.    , PA-C 10/01/20 1517    10/03/20, MD 10/02/20 [phone]

## 2020-10-01 NOTE — Discharge Instructions (Addendum)
You have been evaluated for your syncope.  Fortunately and no concerning finding were noted.  A Covid test have been obtained, results can be a follow-up through MyChart in the next 24 hours.  Follow-up with your doctor for further care, return if you have any concern.

## 2020-10-29 ENCOUNTER — Other Ambulatory Visit: Payer: Self-pay | Admitting: *Deleted

## 2020-10-29 DIAGNOSIS — Z87891 Personal history of nicotine dependence: Secondary | ICD-10-CM

## 2020-10-29 DIAGNOSIS — F17210 Nicotine dependence, cigarettes, uncomplicated: Secondary | ICD-10-CM

## 2020-11-11 ENCOUNTER — Other Ambulatory Visit: Payer: Self-pay | Admitting: Family Medicine

## 2020-11-11 DIAGNOSIS — M85852 Other specified disorders of bone density and structure, left thigh: Secondary | ICD-10-CM

## 2020-11-30 ENCOUNTER — Telehealth: Payer: Self-pay | Admitting: Acute Care

## 2020-12-01 NOTE — Telephone Encounter (Signed)
Spoke with pt and advised that SDMV for 12/02/20 has been changed to a televisit. Pt verbalized understanding.

## 2020-12-01 NOTE — Telephone Encounter (Signed)
LMTC x 1  

## 2020-12-02 ENCOUNTER — Encounter: Payer: Self-pay | Admitting: Acute Care

## 2020-12-02 ENCOUNTER — Other Ambulatory Visit: Payer: Self-pay

## 2020-12-02 ENCOUNTER — Ambulatory Visit
Admission: RE | Admit: 2020-12-02 | Discharge: 2020-12-02 | Disposition: A | Discharge status: Home / Self Care | Payer: Medicare HMO | Source: Ambulatory Visit | Attending: Acute Care | Admitting: Acute Care

## 2020-12-02 ENCOUNTER — Ambulatory Visit (INDEPENDENT_AMBULATORY_CARE_PROVIDER_SITE_OTHER): Payer: Medicare HMO | Admitting: Acute Care

## 2020-12-02 DIAGNOSIS — F17210 Nicotine dependence, cigarettes, uncomplicated: Secondary | ICD-10-CM | POA: Diagnosis not present

## 2020-12-02 DIAGNOSIS — Z87891 Personal history of nicotine dependence: Secondary | ICD-10-CM

## 2020-12-02 NOTE — Patient Instructions (Signed)
Thank you for participating in the [place] [facility] Scre[facility]. It was our pleasure to meet you today. We will call you with the results of your scan within the next few days. Your scan will be assigned a Lung RADS category score by the physicians reading the scans.  This Lung RADS score determines follow up scanning.  See below for description of categories, and follow up screening recommendations. We will be in touch to schedule your follow up screening annually or based on recommendations of our providers. We will fax a copy of your scan results to your Primary Care Physician, or the physician who referred you to the program, to ensure they have the results. Please call the office if you have any questions or concerns regarding your scanning experience or results.  Our office number is [phone]. Please speak with Denise Phelps, RN. She is our Lung Cancer Screening RN. If she is unavailable when you call, please have the office staff send her a message. She will return your call at her earliest convenience. Remember, if your scan is normal, we will scan you annually as long as you continue to meet the criteria for the program. (Age 55-77, Current smoker or smoker who has quit within the last 15 years). If you are a smoker, remember, quitting is the single most powerful action that you can take to decrease your risk of lung cancer and other pulmonary, breathing related problems. We know quitting is hard, and we are here to help.  Please let us know if there is anything we can do to help you meet your goal of quitting. If you are a former smoker, congratulations. We are proud of you! Remain smoke free! Remember you can refer friends or family members through the number above.  We will screen them to make sure they meet criteria for the program. Thank you for helping us take better care of you by participating in Lung Screening.  Lung RADS Categories:  Lung RADS 1: no nodules  or definitely non-concerning nodules.  Recommendation is for a repeat annual scan in 12 months.  Lung RADS 2:  nodules that are non-concerning in appearance and behavior with a very low likelihood of becoming an active cancer. Recommendation is for a repeat annual scan in 12 months.  Lung RADS 3: nodules that are probably non-concerning , includes nodules with a low likelihood of becoming an active cancer.  Recommendation is for a 6-month repeat screening scan. Often noted after an upper respiratory illness. We will be in touch to make sure you have no questions, and to schedule your 6-month scan.  Lung RADS 4 A: nodules with concerning findings, recommendation is most often for a follow up scan in 3 months or additional testing based on our provider's assessment of the scan. We will be in touch to make sure you have no questions and to schedule the recommended 3 month follow up scan.  Lung RADS 4 B:  indicates findings that are concerning. We will be in touch with you to schedule additional diagnostic testing based on our provider's  assessment of the scan.   

## 2020-12-02 NOTE — Progress Notes (Signed)
Virtual Visit via Telephone Note  I connected with Secundino Ginger on 12/02/20 at 12:00 PM EDT by telephone and verified that I am speaking with the correct person using two identifiers.  Location: Patient: Home Provider: [address], [place], [place], [address]    I discussed the limitations, risks, security and privacy concerns of performing an evaluation and management service by telephone and the availability of in person appointments. I also discussed with the patient that there may be a patient responsible charge related to this service. The patient expressed understanding and agreed to proceed.    Shared Decision Making Visit Lung Cancer Screening Program [phone])   Eligibility: Age 70 y.o. Pack Years Smoking History Calculation 56 pack year smoking history (# packs/per year x # years smoked) Recent History of coughing up blood  no Unexplained weight loss? no ( >Than 15 pounds within the last 6 months ) Prior History Lung / other cancer no (Diagnosis within the last 5 years already requiring surveillance chest CT Scans). Smoking Status Former Smoker Former Smokers: Years since quit: < 1 year  Quit Date: 03/24/2020  Visit Components: Discussion included one or more decision making aids. yes Discussion included risk/benefits of screening. yes Discussion included potential follow up diagnostic testing for abnormal scans. yes Discussion included meaning and risk of over diagnosis. yes Discussion included meaning and risk of False Positives. yes Discussion included meaning of total radiation exposure. yes  Counseling Included: Importance of adherence to annual lung cancer LDCT screening. yes Impact of comorbidities on ability to participate in the program. yes Ability and willingness to under diagnostic treatment. yes  Smoking Cessation Counseling: Current Smokers:  Discussed importance of smoking cessation. yes Information about tobacco cessation classes and  interventions provided to patient. yes Patient provided with "ticket" for LDCT Scan. yes Symptomatic Patient. no  Counseling NA Diagnosis Code: Tobacco Use Z72.0 Asymptomatic Patient yes  Counseling (Intermediate counseling: > three minutes counseling) K8003 Former Smokers:  Discussed the importance of maintaining cigarette abstinence. yes Diagnosis Code: Personal History of Nicotine Dependence. K91.791 Information about tobacco cessation classes and interventions provided to patient. Yes Patient provided with "ticket" for LDCT Scan. yes Written Order for Lung Cancer Screening with LDCT placed in Epic. Yes (CT Chest Lung Cancer Screening Low Dose W/O CM) [id number] Z12.2-Screening of respiratory organs Z87.891-Personal history of nicotine dependence  I spent 25 minutes of face to face time with Ms. Haman discussing the risks and benefits of lung cancer screening. We viewed a power point together that explained in detail the above noted topics. We took the time to pause the power point at intervals to allow for questions to be asked and answered to ensure understanding. We discussed that she had taken the single most powerful action possible to decrease her risk of developing lung cancer when she quit smoking. I counseled her to remain smoke free, and to contact me if she ever had the desire to smoke again so that I can provide resources and tools to help support the effort to remain smoke free. We discussed the time and location of the scan, and that either  Abigail Miyamoto RN or I will call with the results within  24-48 hours of receiving them. She has my card and contact information in the event she needs to speak with me, in addition to a copy of the power point we reviewed as a resource. She verbalized understanding of all of the above and had no further questions upon leaving the  office.     I explained to the patient that there has been a high incidence of coronary artery disease noted on  these exams. I explained that this is a non-gated exam therefore degree or severity cannot be determined. This patient is on statin therapy. I have asked the patient to follow-up with their PCP regarding any incidental finding of coronary artery disease and management with diet or medication as they feel is clinically indicated. The patient verbalized understanding of the above and had no further questions.  Pt. Quit smoking 03/24/2021. I congratulated her on this amazing accomplishment.     Bevelyn Ngo, NP 12/02/2020

## 2020-12-11 ENCOUNTER — Encounter: Payer: Self-pay | Admitting: *Deleted

## 2020-12-11 DIAGNOSIS — F17210 Nicotine dependence, cigarettes, uncomplicated: Secondary | ICD-10-CM

## 2020-12-11 DIAGNOSIS — Z87891 Personal history of nicotine dependence: Secondary | ICD-10-CM

## 2020-12-11 NOTE — Progress Notes (Signed)
Please call patient and let them  know their  low dose Ct was read as a Lung RADS 1, negative study: no nodules or definitely benign nodules. Radiology recommendation is for a repeat LDCT in 12 months. .Please let them  know we will order and schedule their  annual screening scan for 11/2021. Please let them  know there was notation of CAD on their  scan.  Please remind the patient  that this is a non-gated exam therefore degree or severity of disease  cannot be determined. Please have them  follow up with their PCP regarding potential risk factor modification, dietary therapy or pharmacologic therapy if clinically indicated. Pt.  is  currently on statin therapy. Please place order for annual  screening scan for  11/2021 and fax results to PCP. Thanks so much. 

## 2021-01-28 DIAGNOSIS — Z1389 Encounter for screening for other disorder: Secondary | ICD-10-CM | POA: Diagnosis not present

## 2021-01-28 DIAGNOSIS — Z23 Encounter for immunization: Secondary | ICD-10-CM | POA: Diagnosis not present

## 2021-01-28 DIAGNOSIS — E7800 Pure hypercholesterolemia, unspecified: Secondary | ICD-10-CM | POA: Diagnosis not present

## 2021-01-28 DIAGNOSIS — Z0000 Encounter for general adult medical examination without abnormal findings: Secondary | ICD-10-CM | POA: Diagnosis not present

## 2021-01-28 DIAGNOSIS — E559 Vitamin D deficiency, unspecified: Secondary | ICD-10-CM | POA: Diagnosis not present

## 2021-01-28 DIAGNOSIS — M85852 Other specified disorders of bone density and structure, left thigh: Secondary | ICD-10-CM | POA: Diagnosis not present

## 2021-01-28 DIAGNOSIS — R350 Frequency of micturition: Secondary | ICD-10-CM | POA: Diagnosis not present

## 2021-01-28 DIAGNOSIS — I10 Essential (primary) hypertension: Secondary | ICD-10-CM | POA: Diagnosis not present

## 2021-01-28 DIAGNOSIS — R42 Dizziness and giddiness: Secondary | ICD-10-CM | POA: Diagnosis not present

## 2021-02-19 DIAGNOSIS — R399 Unspecified symptoms and signs involving the genitourinary system: Secondary | ICD-10-CM | POA: Diagnosis not present

## 2021-04-09 ENCOUNTER — Telehealth (HOSPITAL_COMMUNITY): Payer: Self-pay

## 2021-04-09 NOTE — Telephone Encounter (Signed)
Called patient she doesn't want to schedule, feels it is not necessary. Recall removed. (27yr fu bilat carotid + PA)

## 2021-05-10 ENCOUNTER — Other Ambulatory Visit: Payer: Self-pay | Admitting: Family Medicine

## 2021-05-10 DIAGNOSIS — M85852 Other specified disorders of bone density and structure, left thigh: Secondary | ICD-10-CM

## 2021-05-18 ENCOUNTER — Other Ambulatory Visit: Payer: Medicare HMO

## 2021-07-27 DIAGNOSIS — H35371 Puckering of macula, right eye: Secondary | ICD-10-CM | POA: Diagnosis not present

## 2021-07-27 DIAGNOSIS — H25812 Combined forms of age-related cataract, left eye: Secondary | ICD-10-CM | POA: Diagnosis not present

## 2021-10-22 DIAGNOSIS — U071 COVID-19: Secondary | ICD-10-CM | POA: Diagnosis not present

## 2021-12-02 ENCOUNTER — Other Ambulatory Visit: Payer: Medicare HMO

## 2021-12-02 ENCOUNTER — Other Ambulatory Visit: Payer: Self-pay | Admitting: Acute Care

## 2021-12-02 DIAGNOSIS — Z87891 Personal history of nicotine dependence: Secondary | ICD-10-CM

## 2021-12-02 DIAGNOSIS — F17210 Nicotine dependence, cigarettes, uncomplicated: Secondary | ICD-10-CM

## 2021-12-06 ENCOUNTER — Other Ambulatory Visit: Payer: Self-pay | Admitting: Family Medicine

## 2021-12-06 DIAGNOSIS — M85852 Other specified disorders of bone density and structure, left thigh: Secondary | ICD-10-CM

## 2021-12-07 ENCOUNTER — Other Ambulatory Visit: Payer: Medicare HMO

## 2021-12-29 ENCOUNTER — Inpatient Hospital Stay: Admission: RE | Admit: 2021-12-29 | Payer: Medicare HMO | Source: Ambulatory Visit

## 2021-12-29 ENCOUNTER — Ambulatory Visit
Admission: RE | Admit: 2021-12-29 | Discharge: 2021-12-29 | Disposition: A | Discharge status: Home / Self Care | Payer: Medicare HMO | Source: Ambulatory Visit | Attending: Acute Care | Admitting: Acute Care

## 2021-12-29 DIAGNOSIS — F17210 Nicotine dependence, cigarettes, uncomplicated: Secondary | ICD-10-CM | POA: Diagnosis not present

## 2021-12-29 DIAGNOSIS — Z87891 Personal history of nicotine dependence: Secondary | ICD-10-CM

## 2021-12-31 ENCOUNTER — Other Ambulatory Visit: Payer: Self-pay | Admitting: Acute Care

## 2021-12-31 DIAGNOSIS — Z122 Encounter for screening for malignant neoplasm of respiratory organs: Secondary | ICD-10-CM

## 2021-12-31 DIAGNOSIS — F17210 Nicotine dependence, cigarettes, uncomplicated: Secondary | ICD-10-CM

## 2021-12-31 DIAGNOSIS — Z87891 Personal history of nicotine dependence: Secondary | ICD-10-CM

## 2022-05-20 ENCOUNTER — Other Ambulatory Visit: Payer: Medicare HMO

## 2022-06-22 DIAGNOSIS — J069 Acute upper respiratory infection, unspecified: Secondary | ICD-10-CM | POA: Diagnosis not present

## 2022-11-01 ENCOUNTER — Inpatient Hospital Stay: Admission: RE | Admit: 2022-11-01 | Payer: Medicare HMO | Source: Ambulatory Visit

## 2022-12-02 ENCOUNTER — Other Ambulatory Visit: Payer: Self-pay | Admitting: Acute Care

## 2022-12-02 DIAGNOSIS — F17210 Nicotine dependence, cigarettes, uncomplicated: Secondary | ICD-10-CM

## 2022-12-02 DIAGNOSIS — Z122 Encounter for screening for malignant neoplasm of respiratory organs: Secondary | ICD-10-CM

## 2022-12-02 DIAGNOSIS — Z87891 Personal history of nicotine dependence: Secondary | ICD-10-CM

## 2023-01-02 DIAGNOSIS — I10 Essential (primary) hypertension: Secondary | ICD-10-CM | POA: Diagnosis not present

## 2023-01-02 DIAGNOSIS — I700 Atherosclerosis of aorta: Secondary | ICD-10-CM | POA: Diagnosis not present

## 2023-01-02 DIAGNOSIS — E7800 Pure hypercholesterolemia, unspecified: Secondary | ICD-10-CM | POA: Diagnosis not present

## 2023-01-02 DIAGNOSIS — R42 Dizziness and giddiness: Secondary | ICD-10-CM | POA: Diagnosis not present

## 2023-01-02 DIAGNOSIS — Z0000 Encounter for general adult medical examination without abnormal findings: Secondary | ICD-10-CM | POA: Diagnosis not present

## 2023-01-02 DIAGNOSIS — E559 Vitamin D deficiency, unspecified: Secondary | ICD-10-CM | POA: Diagnosis not present

## 2023-01-02 DIAGNOSIS — M85852 Other specified disorders of bone density and structure, left thigh: Secondary | ICD-10-CM | POA: Diagnosis not present

## 2023-01-02 DIAGNOSIS — Z23 Encounter for immunization: Secondary | ICD-10-CM | POA: Diagnosis not present

## 2023-01-02 DIAGNOSIS — Z1331 Encounter for screening for depression: Secondary | ICD-10-CM | POA: Diagnosis not present

## 2023-01-03 ENCOUNTER — Other Ambulatory Visit: Payer: Self-pay | Admitting: Family Medicine

## 2023-01-03 DIAGNOSIS — M8588 Other specified disorders of bone density and structure, other site: Secondary | ICD-10-CM

## 2023-01-04 ENCOUNTER — Inpatient Hospital Stay: Admission: RE | Admit: 2023-01-04 | Payer: Medicare HMO | Source: Ambulatory Visit

## 2023-01-05 DIAGNOSIS — J019 Acute sinusitis, unspecified: Secondary | ICD-10-CM | POA: Diagnosis not present

## 2023-01-05 DIAGNOSIS — R051 Acute cough: Secondary | ICD-10-CM | POA: Diagnosis not present

## 2023-01-24 ENCOUNTER — Ambulatory Visit
Admission: RE | Admit: 2023-01-24 | Discharge: 2023-01-24 | Disposition: A | Discharge status: Home / Self Care | Payer: Medicare HMO | Source: Ambulatory Visit | Attending: Acute Care | Admitting: Acute Care

## 2023-01-24 DIAGNOSIS — Z87891 Personal history of nicotine dependence: Secondary | ICD-10-CM

## 2023-01-24 DIAGNOSIS — F17210 Nicotine dependence, cigarettes, uncomplicated: Secondary | ICD-10-CM

## 2023-01-24 DIAGNOSIS — Z122 Encounter for screening for malignant neoplasm of respiratory organs: Secondary | ICD-10-CM

## 2023-01-30 ENCOUNTER — Other Ambulatory Visit: Payer: Self-pay | Admitting: Acute Care

## 2023-01-30 DIAGNOSIS — F17210 Nicotine dependence, cigarettes, uncomplicated: Secondary | ICD-10-CM

## 2023-01-30 DIAGNOSIS — Z122 Encounter for screening for malignant neoplasm of respiratory organs: Secondary | ICD-10-CM

## 2023-01-30 DIAGNOSIS — Z87891 Personal history of nicotine dependence: Secondary | ICD-10-CM

## 2023-03-16 DIAGNOSIS — H43813 Vitreous degeneration, bilateral: Secondary | ICD-10-CM | POA: Diagnosis not present

## 2023-03-16 DIAGNOSIS — Z961 Presence of intraocular lens: Secondary | ICD-10-CM | POA: Diagnosis not present

## 2023-03-16 DIAGNOSIS — H25812 Combined forms of age-related cataract, left eye: Secondary | ICD-10-CM | POA: Diagnosis not present

## 2023-03-16 DIAGNOSIS — H35371 Puckering of macula, right eye: Secondary | ICD-10-CM | POA: Diagnosis not present

## 2023-07-06 DIAGNOSIS — Z20828 Contact with and (suspected) exposure to other viral communicable diseases: Secondary | ICD-10-CM | POA: Diagnosis not present

## 2023-07-06 DIAGNOSIS — F439 Reaction to severe stress, unspecified: Secondary | ICD-10-CM | POA: Diagnosis not present

## 2023-07-06 DIAGNOSIS — J029 Acute pharyngitis, unspecified: Secondary | ICD-10-CM | POA: Diagnosis not present

## 2023-08-16 DIAGNOSIS — I10 Essential (primary) hypertension: Secondary | ICD-10-CM | POA: Diagnosis not present

## 2023-08-16 DIAGNOSIS — R300 Dysuria: Secondary | ICD-10-CM | POA: Diagnosis not present

## 2023-08-16 DIAGNOSIS — R42 Dizziness and giddiness: Secondary | ICD-10-CM | POA: Diagnosis not present

## 2023-08-16 DIAGNOSIS — R55 Syncope and collapse: Secondary | ICD-10-CM | POA: Diagnosis not present

## 2023-10-06 DIAGNOSIS — H52213 Irregular astigmatism, bilateral: Secondary | ICD-10-CM | POA: Diagnosis not present

## 2023-10-06 DIAGNOSIS — H524 Presbyopia: Secondary | ICD-10-CM | POA: Diagnosis not present

## 2023-10-06 DIAGNOSIS — H25812 Combined forms of age-related cataract, left eye: Secondary | ICD-10-CM | POA: Diagnosis not present

## 2023-12-12 DIAGNOSIS — H25812 Combined forms of age-related cataract, left eye: Secondary | ICD-10-CM | POA: Diagnosis not present

## 2023-12-12 DIAGNOSIS — H2512 Age-related nuclear cataract, left eye: Secondary | ICD-10-CM | POA: Diagnosis not present

## 2023-12-12 DIAGNOSIS — H5703 Miosis: Secondary | ICD-10-CM | POA: Diagnosis not present

## 2023-12-12 DIAGNOSIS — H268 Other specified cataract: Secondary | ICD-10-CM | POA: Diagnosis not present

## 2024-01-01 DIAGNOSIS — R5383 Other fatigue: Secondary | ICD-10-CM | POA: Diagnosis not present

## 2024-01-01 DIAGNOSIS — E7800 Pure hypercholesterolemia, unspecified: Secondary | ICD-10-CM | POA: Diagnosis not present

## 2024-01-01 DIAGNOSIS — I10 Essential (primary) hypertension: Secondary | ICD-10-CM | POA: Diagnosis not present

## 2024-01-03 ENCOUNTER — Other Ambulatory Visit (HOSPITAL_COMMUNITY): Payer: Self-pay | Admitting: Family Medicine

## 2024-01-03 DIAGNOSIS — E7800 Pure hypercholesterolemia, unspecified: Secondary | ICD-10-CM

## 2024-01-03 DIAGNOSIS — Z8249 Family history of ischemic heart disease and other diseases of the circulatory system: Secondary | ICD-10-CM

## 2024-01-08 DIAGNOSIS — I10 Essential (primary) hypertension: Secondary | ICD-10-CM | POA: Diagnosis not present

## 2024-01-08 DIAGNOSIS — Z8249 Family history of ischemic heart disease and other diseases of the circulatory system: Secondary | ICD-10-CM | POA: Diagnosis not present

## 2024-01-08 DIAGNOSIS — R399 Unspecified symptoms and signs involving the genitourinary system: Secondary | ICD-10-CM | POA: Diagnosis not present

## 2024-01-09 ENCOUNTER — Telehealth (HOSPITAL_COMMUNITY): Payer: Self-pay | Admitting: Emergency Medicine

## 2024-01-09 NOTE — Telephone Encounter (Signed)
 Attempted to call patient regarding upcoming cardiac CT appointment. Left message on voicemail with name and callback number Rockwell Alexandria RN Navigator Cardiac Imaging [facility] [phone] Office [phone] Cell

## 2024-01-10 ENCOUNTER — Ambulatory Visit (HOSPITAL_COMMUNITY)

## 2024-01-10 ENCOUNTER — Other Ambulatory Visit (HOSPITAL_COMMUNITY)

## 2024-01-10 ENCOUNTER — Ambulatory Visit (HOSPITAL_COMMUNITY)
Admission: RE | Admit: 2024-01-10 | Discharge: 2024-01-10 | Disposition: A | Discharge status: Home / Self Care | Source: Ambulatory Visit | Attending: Family Medicine | Admitting: Family Medicine

## 2024-01-10 DIAGNOSIS — Z6830 Body mass index (BMI) 30.0-30.9, adult: Secondary | ICD-10-CM | POA: Diagnosis not present

## 2024-01-10 DIAGNOSIS — I2510 Atherosclerotic heart disease of native coronary artery without angina pectoris: Secondary | ICD-10-CM | POA: Insufficient documentation

## 2024-01-10 DIAGNOSIS — I700 Atherosclerosis of aorta: Secondary | ICD-10-CM | POA: Insufficient documentation

## 2024-01-10 DIAGNOSIS — Z716 Tobacco abuse counseling: Secondary | ICD-10-CM | POA: Diagnosis not present

## 2024-01-10 DIAGNOSIS — Z880 Allergy status to penicillin: Secondary | ICD-10-CM | POA: Diagnosis not present

## 2024-01-10 DIAGNOSIS — I5032 Chronic diastolic (congestive) heart failure: Secondary | ICD-10-CM | POA: Diagnosis present

## 2024-01-10 DIAGNOSIS — E871 Hypo-osmolality and hyponatremia: Secondary | ICD-10-CM | POA: Diagnosis present

## 2024-01-10 DIAGNOSIS — E66811 Obesity, class 1: Secondary | ICD-10-CM | POA: Diagnosis present

## 2024-01-10 DIAGNOSIS — Z79899 Other long term (current) drug therapy: Secondary | ICD-10-CM | POA: Diagnosis not present

## 2024-01-10 DIAGNOSIS — Z8249 Family history of ischemic heart disease and other diseases of the circulatory system: Secondary | ICD-10-CM | POA: Diagnosis not present

## 2024-01-10 DIAGNOSIS — Z7982 Long term (current) use of aspirin: Secondary | ICD-10-CM | POA: Diagnosis not present

## 2024-01-10 DIAGNOSIS — R079 Chest pain, unspecified: Secondary | ICD-10-CM | POA: Diagnosis not present

## 2024-01-10 DIAGNOSIS — R931 Abnormal findings on diagnostic imaging of heart and coronary circulation: Secondary | ICD-10-CM | POA: Diagnosis not present

## 2024-01-10 DIAGNOSIS — E7800 Pure hypercholesterolemia, unspecified: Secondary | ICD-10-CM | POA: Insufficient documentation

## 2024-01-10 DIAGNOSIS — F17200 Nicotine dependence, unspecified, uncomplicated: Secondary | ICD-10-CM | POA: Diagnosis not present

## 2024-01-10 DIAGNOSIS — N390 Urinary tract infection, site not specified: Secondary | ICD-10-CM | POA: Diagnosis present

## 2024-01-10 DIAGNOSIS — I160 Hypertensive urgency: Secondary | ICD-10-CM | POA: Diagnosis present

## 2024-01-10 DIAGNOSIS — E876 Hypokalemia: Secondary | ICD-10-CM | POA: Diagnosis present

## 2024-01-10 DIAGNOSIS — K449 Diaphragmatic hernia without obstruction or gangrene: Secondary | ICD-10-CM | POA: Insufficient documentation

## 2024-01-10 DIAGNOSIS — I200 Unstable angina: Secondary | ICD-10-CM | POA: Diagnosis not present

## 2024-01-10 DIAGNOSIS — I10 Essential (primary) hypertension: Secondary | ICD-10-CM | POA: Diagnosis not present

## 2024-01-10 DIAGNOSIS — Z823 Family history of stroke: Secondary | ICD-10-CM | POA: Diagnosis not present

## 2024-01-10 DIAGNOSIS — Z87891 Personal history of nicotine dependence: Secondary | ICD-10-CM | POA: Diagnosis not present

## 2024-01-10 DIAGNOSIS — Z7902 Long term (current) use of antithrombotics/antiplatelets: Secondary | ICD-10-CM | POA: Diagnosis not present

## 2024-01-10 DIAGNOSIS — I739 Peripheral vascular disease, unspecified: Secondary | ICD-10-CM | POA: Diagnosis present

## 2024-01-10 DIAGNOSIS — I110 Hypertensive heart disease with heart failure: Secondary | ICD-10-CM | POA: Diagnosis present

## 2024-01-10 DIAGNOSIS — I25110 Atherosclerotic heart disease of native coronary artery with unstable angina pectoris: Secondary | ICD-10-CM | POA: Diagnosis present

## 2024-01-10 DIAGNOSIS — E785 Hyperlipidemia, unspecified: Secondary | ICD-10-CM | POA: Diagnosis present

## 2024-01-10 MED ORDER — IOHEXOL 350 MG/ML SOLN
100.0000 mL | Freq: Once | INTRAVENOUS | Status: AC | PRN
  Administered 2024-01-10: 100 mL via INTRAVENOUS

## 2024-01-10 MED ORDER — NITROGLYCERIN 0.4 MG SL SUBL
0.8000 mg | SUBLINGUAL_TABLET | Freq: Once | SUBLINGUAL | Status: AC
  Administered 2024-01-10: 0.8 mg via SUBLINGUAL

## 2024-01-11 ENCOUNTER — Other Ambulatory Visit: Payer: Self-pay | Admitting: Cardiovascular Disease

## 2024-01-11 ENCOUNTER — Ambulatory Visit (HOSPITAL_BASED_OUTPATIENT_CLINIC_OR_DEPARTMENT_OTHER)
Admission: RE | Admit: 2024-01-11 | Discharge: 2024-01-11 | Disposition: A | Discharge status: Home / Self Care | Source: Ambulatory Visit | Attending: Cardiovascular Disease | Admitting: Cardiovascular Disease

## 2024-01-11 DIAGNOSIS — R0789 Other chest pain: Secondary | ICD-10-CM | POA: Insufficient documentation

## 2024-01-11 DIAGNOSIS — I2510 Atherosclerotic heart disease of native coronary artery without angina pectoris: Secondary | ICD-10-CM | POA: Diagnosis not present

## 2024-01-11 DIAGNOSIS — R931 Abnormal findings on diagnostic imaging of heart and coronary circulation: Secondary | ICD-10-CM | POA: Insufficient documentation

## 2024-01-12 ENCOUNTER — Emergency Department (HOSPITAL_COMMUNITY)

## 2024-01-12 ENCOUNTER — Other Ambulatory Visit: Payer: Self-pay

## 2024-01-12 ENCOUNTER — Inpatient Hospital Stay (HOSPITAL_COMMUNITY)
Admission: EM | Admit: 2024-01-12 | Discharge: 2024-01-16 | DRG: 322 | Disposition: A | Discharge status: Home / Self Care | Attending: Internal Medicine | Admitting: Internal Medicine

## 2024-01-12 ENCOUNTER — Encounter (HOSPITAL_COMMUNITY): Payer: Self-pay | Admitting: Emergency Medicine

## 2024-01-12 DIAGNOSIS — Z880 Allergy status to penicillin: Secondary | ICD-10-CM

## 2024-01-12 DIAGNOSIS — E66811 Obesity, class 1: Secondary | ICD-10-CM | POA: Diagnosis present

## 2024-01-12 DIAGNOSIS — Z7982 Long term (current) use of aspirin: Secondary | ICD-10-CM

## 2024-01-12 DIAGNOSIS — I739 Peripheral vascular disease, unspecified: Secondary | ICD-10-CM | POA: Diagnosis present

## 2024-01-12 DIAGNOSIS — E876 Hypokalemia: Secondary | ICD-10-CM | POA: Diagnosis present

## 2024-01-12 DIAGNOSIS — Z79899 Other long term (current) drug therapy: Secondary | ICD-10-CM

## 2024-01-12 DIAGNOSIS — I110 Hypertensive heart disease with heart failure: Secondary | ICD-10-CM | POA: Diagnosis present

## 2024-01-12 DIAGNOSIS — I25110 Atherosclerotic heart disease of native coronary artery with unstable angina pectoris: Principal | ICD-10-CM | POA: Diagnosis present

## 2024-01-12 DIAGNOSIS — Z87891 Personal history of nicotine dependence: Secondary | ICD-10-CM

## 2024-01-12 DIAGNOSIS — Z716 Tobacco abuse counseling: Secondary | ICD-10-CM

## 2024-01-12 DIAGNOSIS — N390 Urinary tract infection, site not specified: Secondary | ICD-10-CM | POA: Diagnosis present

## 2024-01-12 DIAGNOSIS — R079 Chest pain, unspecified: Secondary | ICD-10-CM | POA: Diagnosis not present

## 2024-01-12 DIAGNOSIS — Z955 Presence of coronary angioplasty implant and graft: Secondary | ICD-10-CM

## 2024-01-12 DIAGNOSIS — Z823 Family history of stroke: Secondary | ICD-10-CM

## 2024-01-12 DIAGNOSIS — Z8249 Family history of ischemic heart disease and other diseases of the circulatory system: Secondary | ICD-10-CM

## 2024-01-12 DIAGNOSIS — E871 Hypo-osmolality and hyponatremia: Secondary | ICD-10-CM | POA: Diagnosis present

## 2024-01-12 DIAGNOSIS — E785 Hyperlipidemia, unspecified: Secondary | ICD-10-CM | POA: Diagnosis present

## 2024-01-12 DIAGNOSIS — I200 Unstable angina: Secondary | ICD-10-CM | POA: Diagnosis present

## 2024-01-12 DIAGNOSIS — I5032 Chronic diastolic (congestive) heart failure: Secondary | ICD-10-CM | POA: Diagnosis present

## 2024-01-12 DIAGNOSIS — Z7902 Long term (current) use of antithrombotics/antiplatelets: Secondary | ICD-10-CM

## 2024-01-12 DIAGNOSIS — I10 Essential (primary) hypertension: Secondary | ICD-10-CM | POA: Diagnosis present

## 2024-01-12 DIAGNOSIS — I160 Hypertensive urgency: Principal | ICD-10-CM | POA: Diagnosis present

## 2024-01-12 DIAGNOSIS — Z6830 Body mass index (BMI) 30.0-30.9, adult: Secondary | ICD-10-CM

## 2024-01-12 DIAGNOSIS — F17200 Nicotine dependence, unspecified, uncomplicated: Secondary | ICD-10-CM | POA: Diagnosis present

## 2024-01-12 LAB — BASIC METABOLIC PANEL WITH GFR
Anion gap: 10 (ref 5–15)
BUN: 15 mg/dL (ref 8–23)
CO2: 24 mmol/L (ref 22–32)
Calcium: 9.2 mg/dL (ref 8.9–10.3)
Chloride: 101 mmol/L (ref 98–111)
Creatinine, Ser: 0.82 mg/dL (ref 0.44–1.00)
GFR, Estimated: >60 mL/min (ref >60)
Glucose, Bld: 93 mg/dL (ref 70–99)
Potassium: 4.3 mmol/L (ref 3.5–5.1)
Sodium: 135 mmol/L (ref 135–145)

## 2024-01-12 LAB — CBC
HCT: 41.3 % (ref 36.0–46.0)
Hemoglobin: 13.9 g/dL (ref 12.0–15.0)
MCH: 29.1 pg (ref 26.0–34.0)
MCHC: 33.7 g/dL (ref 30.0–36.0)
MCV: 86.4 fL (ref 80.0–100.0)
Platelets: 208 K/uL (ref 150–400)
RBC: 4.78 MIL/uL (ref 3.87–5.11)
RDW: 13 % (ref 11.5–15.5)
WBC: 5.5 K/uL (ref 4.0–10.5)
nRBC: 0 % (ref 0.0–0.2)

## 2024-01-12 LAB — TROPONIN I (HIGH SENSITIVITY)
Troponin I (High Sensitivity): 4 ng/L (ref <18)
Troponin I (High Sensitivity): 5 ng/L (ref <18)

## 2024-01-12 MED ORDER — NITROGLYCERIN IN D5W 200-5 MCG/ML-% IV SOLN
0.0000 ug/min | INTRAVENOUS | Status: DC
  Administered 2024-01-12: 10 ug/min via INTRAVENOUS
  Filled 2024-01-12: qty 250

## 2024-01-12 MED ORDER — HEPARIN (PORCINE) 25000 UT/250ML-% IV SOLN
1000.0000 [IU]/h | INTRAVENOUS | Status: DC
  Administered 2024-01-12: 800 [IU]/h via INTRAVENOUS
  Administered 2024-01-13: 950 [IU]/h via INTRAVENOUS
  Administered 2024-01-15: 1000 [IU]/h via INTRAVENOUS
  Filled 2024-01-12 (×3): qty 250

## 2024-01-12 MED ORDER — ALPRAZOLAM 0.25 MG PO TABS
0.5000 mg | ORAL_TABLET | Freq: Once | ORAL | Status: AC
  Administered 2024-01-12: 0.5 mg via ORAL
  Filled 2024-01-12: qty 2

## 2024-01-12 MED ORDER — PROCHLORPERAZINE EDISYLATE 10 MG/2ML IJ SOLN: 5.0000 mg | Freq: Four times a day (QID) | INTRAMUSCULAR | Status: DC | PRN

## 2024-01-12 MED ORDER — HEPARIN BOLUS VIA INFUSION
4000.0000 [IU] | Freq: Once | INTRAVENOUS | Status: AC
  Administered 2024-01-12: 4000 [IU] via INTRAVENOUS
  Filled 2024-01-12: qty 4000

## 2024-01-12 MED ORDER — MELATONIN 5 MG PO TABS: 5.0000 mg | ORAL_TABLET | Freq: Every evening | ORAL | Status: DC | PRN

## 2024-01-12 MED ORDER — POLYETHYLENE GLYCOL 3350 17 G PO PACK: 17.0000 g | PACK | Freq: Every day | ORAL | Status: DC | PRN

## 2024-01-12 MED ORDER — ACETAMINOPHEN 325 MG PO TABS: 650.0000 mg | ORAL_TABLET | Freq: Four times a day (QID) | ORAL | Status: DC | PRN

## 2024-01-12 NOTE — ED Provider Triage Note (Signed)
 Emergency Medicine Provider Triage Evaluation Note  Jaclyn Golden , a 73 y.o. female  was evaluated in triage.  Pt complains of Cp starting this morning.  Patient describes this pain as the center of the chest and a burning sensation.  Notes some exertional fatigue but no recent exertional shortness of breath or exertional chest pain.  She did have a coronary CTA yesterday which showed stenosis of her LAD.  She has not had a recent cardiac cath but the imaging did recommend that she has a cardiac cath.  Review of Systems  Positive: CP, N, fatigue Negative: SOB  Physical Exam  BP (!) 121/97   Pulse 76   Temp 98.8 F (37.1 C) (Oral)   Resp 18   Ht 5' 2 (1.575 m)   Wt 72.6 kg   SpO2 99%   BMI 29.26 kg/m  Gen:   Awake, no distress   Resp:  Normal effort  MSK:   Moves extremities without difficulty  Other:    Medical Decision Making  Medically screening exam initiated at 5:29 PM.  Appropriate orders placed.  LARSEN ZETTEL was informed that the remainder of the evaluation will be completed by another provider, this initial triage assessment does not replace that evaluation, and the importance of remaining in the ED until their evaluation is complete.     Shermon Warren SAILOR, [place] 01/12/24 1731

## 2024-01-12 NOTE — Progress Notes (Signed)
 PHARMACY - ANTICOAGULATION CONSULT NOTE  Pharmacy Consult for heparin Indication: chest pain/ACS  Allergies  Allergen Reactions   Penicillins Other (See Comments)    Pass out   Has patient had a PCN reaction causing immediate rash, facial/tongue/throat swelling, SOB or lightheadedness with hypotension:YES Has patient had a PCN reaction causing severe rash involving mucus membranes or skin necrosis:No Has patient had a PCN reaction that required hospitalization:NO Has patient had a PCN reaction occurring within the last 10 years:NO If all of the above answers are NO, then may proceed with Cephalosporin use.     Patient Measurements: Height: 5' 2 (157.5 cm) Weight: 72.6 kg (160 lb) IBW/kg (Calculated) : 50.1 HEPARIN DW (KG): 65.6  Vital Signs: Temp: 98.2 F (36.8 C) (07/25 2201) Temp Source: Oral (07/25 2201) BP: 205/76 (07/25 2154) Pulse Rate: 66 (07/25 2154)  Labs: Recent Labs    01/12/24 1722 01/12/24 2027  HGB 13.9  --   HCT 41.3  --   PLT 208  --   CREATININE 0.82  --   TROPONINIHS 4 5    Estimated Creatinine Clearance: 55.3 mL/min (by C-G formula based on SCr of 0.82 mg/dL).   Medical History: Past Medical History:  Diagnosis Date   Arthritis    right ankle, wears brace prn   Hypertension    Pain in head    occasional pain back of head on right for last couple of years, to see neurlogist for      Assessment: 28 YOF presenting with CP, hx of CAD, she is not on anticoagulation PTA, CBC wnl  Goal of Therapy:  Heparin level 0.3-0.7 units/ml Monitor platelets by anticoagulation protocol: Yes   Plan:  Heparin 4000 units IV x 1, and gtt at 800 units/hr F/u 8 hour heparin level F/u cards eval and recs  Dorn Poot, PharmD, [facility] Clinical Pharmacist ED Pharmacist Phone # [phone] 01/12/2024 10:35 PM

## 2024-01-12 NOTE — ED Notes (Signed)
 Admitting provider, Dr. Shona at patient bedside.

## 2024-01-12 NOTE — ED Provider Notes (Signed)
 [place] EMERGENCY DEPARTMENT AT [facility] Provider Note   CSN: [id number] Arrival date & time: 01/12/24  1713     Patient presents with: Chest Pain   Jaclyn Golden is a 73 y.o. female history of hypertension here presenting with chest pain.  Patient has multiple family members who had MI.  Her son recently died of massive MI in 07/23/2023.  Patient has some dyspnea on exertion for several months.  Patient saw her PCP and had an outpatient CT coronary that showed severe stenosis in the mid LAD that recommended cardiac catheterization.  Patient has not seen a cardiologist yet.  She states that today she had sudden onset of substernal chest pain.  She states that she also feels weak and tired.  She is concerned that she has a heart attack.   The history is provided by the patient.       Prior to Admission medications   Medication Sig Start Date End Date Taking? Authorizing Provider  Cholecalciferol (VITAMIN D3 PO) Take 1 tablet by mouth daily.     [provider]  co-enzyme Q-10 30 MG capsule Take 30 mg by mouth 3 (three) times daily.    [provider]  etodolac  (LODINE ) 400 MG tablet TAKE 1 TABLET(400 MG) BY MOUTH TWICE DAILY AS NEEDED 05/28/20   Hilts, Ozell, MD  Garlic 400 MG TBEC Take by mouth.    [provider]  hydrochlorothiazide (MICROZIDE) 12.5 MG capsule  11/18/19   [provider]  meclizine  (ANTIVERT ) 25 MG tablet Take 1 tablet (25 mg total) by mouth 3 (three) times daily as needed for dizziness. 01/15/19   Knapp, Iva, MD  Multiple Vitamins-Minerals (MULTIVITAMIN WITH MINERALS) tablet Take 1 tablet by mouth daily.     [provider]  Multiple Vitamins-Minerals (OCUVITE ADULT 50+ PO) Take 1 tablet by mouth daily.     [provider]  Omega-3 Fatty Acids (FISH OIL) 1000 MG CAPS Take 1 capsule by mouth daily.    [provider]  rosuvastatin  (CRESTOR ) 5 MG tablet Take 5 mg by mouth every other day.   09/06/19   [provider]    Allergies: Penicillins    Review of Systems  Cardiovascular:  Positive for chest pain.  All other systems reviewed and are negative.   Updated Vital Signs BP (!) 121/97   Pulse 76   Temp 98.8 F (37.1 C) (Oral)   Resp 18   Ht 5' 2 (1.575 m)   Wt 72.6 kg   SpO2 99%   BMI 29.26 kg/m   Physical Exam Vitals and nursing note reviewed.  Constitutional:      Appearance: She is well-developed.     Comments: Anxious   HENT:     Head: Normocephalic.  Eyes:     Pupils: Pupils are equal, round, and reactive to light.  Cardiovascular:     Rate and Rhythm: Normal rate and regular rhythm.     Heart sounds: Normal heart sounds.  Pulmonary:     Effort: Pulmonary effort is normal.     Breath sounds: Normal breath sounds.  Abdominal:     General: Bowel sounds are normal.     Palpations: Abdomen is soft.  Musculoskeletal:        General: Normal range of motion.     Cervical back: Normal range of motion and neck supple.  Skin:    General: Skin is warm.     Capillary Refill: Capillary refill takes less than  2 seconds.  Neurological:     General: No focal deficit present.     Mental Status: She is alert.  Psychiatric:        Mood and Affect: Mood normal.     (all labs ordered are listed, but only abnormal results are displayed) Labs Reviewed  BASIC METABOLIC PANEL WITH GFR  CBC  TROPONIN I (HIGH SENSITIVITY)  TROPONIN I (HIGH SENSITIVITY)    EKG: EKG Interpretation Date/Time:  Friday January 12 2024 17:22:29 EDT Ventricular Rate:  76 PR Interval:  142 QRS Duration:  70 QT Interval:  380 QTC Calculation: 427 R Axis:   4  Text Interpretation: Normal sinus rhythm Normal ECG When compared with ECG of 01-Oct-2020 14:01, PREVIOUS ECG IS PRESENT Confirmed by Patt Alm DEL [phone]) on 01/12/2024 9:17:49 PM  Radiology: [facility] Chest 2 View Result Date: 01/12/2024 CLINICAL DATA:  Chest pain EXAM: CHEST - 2 VIEW COMPARISON:  Chest x-ray  10/29/2019.  Chest CT 12/11/2023. FINDINGS: The heart size and mediastinal contours are within normal limits. Both lungs are clear. The visualized skeletal structures are unremarkable. IMPRESSION: No active cardiopulmonary disease. Electronically Signed   By: Greig Pique M.D.   On: 01/12/2024 18:13   CT CORONARY FRACTIONAL FLOW RESERVE FLUID ANALYSIS Result Date: 01/11/2024 EXAM: FFRCT ANALYSIS for abnormal coronary CT-A. FINDINGS: FFRct analysis was performed on the original cardiac CT angiogram dataset. Diagrammatic representation of the FFRct analysis is provided in a separate PDF document in PACS. This dictation was created using the PDF document and an interactive 3D model of the results. 3D model is not available in the EMR/PACS. Normal FFR range is >0.80. 1. Left Main: FFRct 0.95 2. LAD: FFRct 0.92 proximal, 0.7 mid. 3. LCX: FFRct 0.95 proximal, 0.91 mid., 0.87 distal.  OM1 FFRct 0.89 4. RCA: FFRct 0.98 proximal, 0.95 mid, 0.94 distal. IMPRESSION: 1. FFRct findings are consistent with severe stenosis in the mid LAD. 2.  Recommend cardiac catheterization. Tiffany C. Raford, MD Electronically Signed   By: Annabella Raford M.D.   On: 01/11/2024 13:51     Procedures   CRITICAL CARE Performed by: Alm DEL Patt   Total critical care time: 38 minutes  Critical care time was exclusive of separately billable procedures and treating other patients.  Critical care was necessary to treat or prevent imminent or life-threatening deterioration.  Critical care was time spent personally by me on the following activities: development of treatment plan with patient and/or surrogate as well as nursing, discussions with consultants, evaluation of patient's response to treatment, examination of patient, obtaining history from patient or surrogate, ordering and performing treatments and interventions, ordering and review of laboratory studies, ordering and review of radiographic studies, pulse oximetry and  re-evaluation of patient's condition.   Medications Ordered in the ED - No data to display                                  Medical Decision Making Jaclyn Golden is a 73 y.o. female here presenting with chest pain.  Patient had CT coronary that showed severe mid LAD lesion.  Patient now is symptomatic.  Will get CBC BMP and troponin and will consult cardiology  9:33 PM Troponin negative x 2.  Given that patient still has symptoms and has LAD lesion on CT scan, I consulted cardiology  10:35 PM I discussed with Dr. Levern from cardiology.  He recommends nitro drip and heparin drip.  He will see patient tomorrow to decide about heart catheterization.  Of note, patient's blood pressure is over 200 so nitro drip will help with hypertension as well.  Consider hypertensive urgency as well  Problems Addressed: Hypertensive urgency: acute illness or injury Unstable angina [facility]): acute illness or injury  Amount and/or Complexity of Data Reviewed Labs: ordered. Decision-making details documented in ED Course. Radiology: ordered and independent interpretation performed. Decision-making details documented in ED Course. ECG/medicine tests: ordered and independent interpretation performed. Decision-making details documented in ED Course.  Risk Prescription drug management.     Final diagnoses:  None    ED Discharge Orders     None          Patt Alm Macho, MD 01/12/24 2236

## 2024-01-12 NOTE — ED Triage Notes (Signed)
 Pt via EMS from home c/o central burning CP since this morning. Pain rated 3/10. Hx HTN with known CAD/occlusion per prior CTA (July 16). 324mg  aspirin  and 250mL NS en route via 18ga L AC. Recent change to BP medication. A/O x 4.   EKG unremarkable

## 2024-01-12 NOTE — H&P (Addendum)
 History and Physical  Jaclyn Golden FMW:[id number] DOB: 1951/06/15 DOA: 01/12/2024  Referring physician: Dr. Patt, EDP  PCP: Seabron Lenis, MD  Outpatient Specialists: Cardiology. Patient coming from: Home.  Chief Complaint: Chest pain  HPI: Jaclyn Golden is a 73 y.o. female with medical history significant for hypertension, hyperlipidemia, coronary artery disease, with severe stenosis of mid LAD followed by cardiology with recommendation for cardiac catheterization, who presents to the ER due to centrally located chest pain, severe, burning, constant, ascending to her upper sternum.  Not alleviated by rest.  Also endorses pain in her lower extremities bilaterally worse with walking.  She stopped taking her home aspirin  due to easily bruising.  In the ER, the patient's chest pain improved after starting heparin  drip and nitroglycerin  drip.  High-sensitivity troponin negative x 2.  No evidence of acute ischemia on her EKG.  EDP discussed the case with cardiology.  Recommended admission for possible heart cath.  Admitted by [facility], hospitalist service.  At the time of this visit, the patient is in the room accompanied by her daughter and son-in-law.  Her chest pain is currently a 2 out of 10 in severity.  The patient lost her son in January 2025 of an MI.  Her daughter also with history of coronary artery disease status post stent placement.  To note, the patient was recently started on telmisartan 20 mg daily and is currently on antibiotics for UTI since 01/08/2024.  ED Course: Temperature is 7.7.  BP 123/57.  Pulse 77, respiration rate 20, O2 saturation 96% on room air.  Review of Systems: Review of systems as noted in the HPI. All other systems reviewed and are negative.   Past Medical History:  Diagnosis Date   Arthritis    right ankle, wears brace prn   Hypertension    Pain in head    occasional pain back of head on right for last couple of years, to see neurlogist for    Past  Surgical History:  Procedure Laterality Date   APPENDECTOMY  1980's   ruptured   BREAST BIOPSY Left 1980   CATARACT EXTRACTION     COLONOSCOPY WITH PROPOFOL  N/A 02/11/2014   Procedure: COLONOSCOPY WITH PROPOFOL ;  Surgeon: Gladis MARLA Louder, MD;  Location: [facility] ENDOSCOPY;  Service: Endoscopy;  Laterality: N/A;   EYE SURGERY     TUBAL LIGATION  1979    Social History:  reports that she quit smoking about 3 years ago. Her smoking use included cigarettes. She started smoking about 50 years ago. She has a 47 pack-year smoking history. She has never used smokeless tobacco. She reports that she does not drink alcohol and does not use drugs.   Allergies  Allergen Reactions   Penicillins Other (See Comments)    Pass out   Has patient had a PCN reaction causing immediate rash, facial/tongue/throat swelling, SOB or lightheadedness with hypotension:YES Has patient had a PCN reaction causing severe rash involving mucus membranes or skin necrosis:No Has patient had a PCN reaction that required hospitalization:NO Has patient had a PCN reaction occurring within the last 10 years:NO If all of the above answers are NO, then may proceed with Cephalosporin use.     Family History  Problem Relation Age of Onset   Stroke Mother    Anuerysm Brother    Heart failure Brother    Cancer Father       Prior to Admission medications   Medication Sig Start Date End Date Taking? Authorizing Provider  Cholecalciferol (  VITAMIN D3 PO) Take 1 tablet by mouth daily.     [provider]  co-enzyme Q-10 30 MG capsule Take 30 mg by mouth 3 (three) times daily.    [provider]  etodolac  (LODINE ) 400 MG tablet TAKE 1 TABLET(400 MG) BY MOUTH TWICE DAILY AS NEEDED 05/28/20   Hilts, Ozell, MD  Garlic 400 MG TBEC Take by mouth.    [provider]  hydrochlorothiazide (MICROZIDE) 12.5 MG capsule  11/18/19   [provider]  meclizine  (ANTIVERT ) 25 MG tablet Take 1 tablet (25 mg  total) by mouth 3 (three) times daily as needed for dizziness. 01/15/19   Knapp, Iva, MD  Multiple Vitamins-Minerals (MULTIVITAMIN WITH MINERALS) tablet Take 1 tablet by mouth daily.     [provider]  Multiple Vitamins-Minerals (OCUVITE ADULT 50+ PO) Take 1 tablet by mouth daily.     [provider]  Omega-3 Fatty Acids (FISH OIL) 1000 MG CAPS Take 1 capsule by mouth daily.    [provider]  rosuvastatin  (CRESTOR ) 5 MG tablet Take 5 mg by mouth every other day.  09/06/19   [provider]    Physical Exam: BP (!) 154/61   Pulse 65   Temp 98.2 F (36.8 C) (Oral)   Resp 20   Ht 5' 2 (1.575 m)   Wt 72.6 kg   SpO2 99%   BMI 29.26 kg/m   General: 73 y.o. year-old female well developed well nourished in no acute distress.  Alert and oriented x3. Cardiovascular: Regular rate and rhythm with no rubs or gallops.  No thyromegaly or JVD noted.  No lower extremity edema. 2/4 pulses in all 4 extremities. Respiratory: Clear to auscultation with no wheezes or rales. Good inspiratory effort. Abdomen: Soft nontender nondistended with normal bowel sounds x4 quadrants. Muskuloskeletal: No cyanosis, clubbing or edema noted bilaterally Neuro: CN II-XII intact, strength, sensation, reflexes Skin: No ulcerative lesions noted or rashes Psychiatry: Judgement and insight appear normal. Mood is appropriate for condition and setting          Labs on Admission:  Basic Metabolic Panel: Recent Labs  Lab 01/12/24 1722  NA 135  K 4.3  CL 101  CO2 24  GLUCOSE 93  BUN 15  CREATININE 0.82  CALCIUM  9.2   Liver Function Tests: No results for input(s): AST, ALT, ALKPHOS, BILITOT, PROT, ALBUMIN in the last 168 hours. No results for input(s): LIPASE, AMYLASE in the last 168 hours. No results for input(s): AMMONIA in the last 168 hours. CBC: Recent Labs  Lab 01/12/24 1722  WBC 5.5  HGB 13.9  HCT 41.3  MCV 86.4  PLT 208   Cardiac Enzymes: No  results for input(s): CKTOTAL, CKMB, CKMBINDEX, TROPONINI in the last 168 hours.  BNP (last 3 results) No results for input(s): BNP in the last 8760 hours.  ProBNP (last 3 results) No results for input(s): PROBNP in the last 8760 hours.  CBG: No results for input(s): GLUCAP in the last 168 hours.  Radiological Exams on Admission: DG Chest 2 View Result Date: 01/12/2024 CLINICAL DATA:  Chest pain EXAM: CHEST - 2 VIEW COMPARISON:  Chest x-ray 10/29/2019.  Chest CT 12/11/2023. FINDINGS: The heart size and mediastinal contours are within normal limits. Both lungs are clear. The visualized skeletal structures are unremarkable. IMPRESSION: No active cardiopulmonary disease. Electronically Signed   By: Greig Pique M.D.   On: 01/12/2024 18:13   CT CORONARY FRACTIONAL FLOW RESERVE FLUID ANALYSIS Result Date: 01/11/2024 EXAM: FFRCT  ANALYSIS for abnormal coronary CT-A. FINDINGS: FFRct analysis was performed on the original cardiac CT angiogram dataset. Diagrammatic representation of the FFRct analysis is provided in a separate PDF document in PACS. This dictation was created using the PDF document and an interactive 3D model of the results. 3D model is not available in the EMR/PACS. Normal FFR range is >0.80. 1. Left Main: FFRct 0.95 2. LAD: FFRct 0.92 proximal, 0.7 mid. 3. LCX: FFRct 0.95 proximal, 0.91 mid., 0.87 distal.  OM1 FFRct 0.89 4. RCA: FFRct 0.98 proximal, 0.95 mid, 0.94 distal. IMPRESSION: 1. FFRct findings are consistent with severe stenosis in the mid LAD. 2.  Recommend cardiac catheterization. Tiffany C. Raford, MD Electronically Signed   By: Annabella Raford M.D.   On: 01/11/2024 13:51    EKG: I independently viewed the EKG done and my findings are as followed: Normal sinus rhythm rate of 66.  Nonspecific ST-T changes.  QTc 416.  Assessment/Plan Present on Admission:  Chest pain  Principal Problem:   Chest pain  Atypical chest pain, rule out ACS Received full dose  aspirin  via EMS High-sensitivity troponin negative x 2 No evidence of acute ischemia on twelve-lead EKG History of coronary artery disease with severe stenosis at mid LAD, seen on CT coronary Currently on heparin  drip and nitro drip, continue Follow 2D echo and fasting lipid panel.  Chronic HFpEF Euvolemic Last 2D echo done on 10/29/2019 revealed LVEF 60 to 65% with grade 1 diastolic dysfunction Follow 2D echo Start strict I's and O's and daily weight  Hyperlipidemia Resume home Crestor  5 mg nightly Follow fasting lipid panel.  Hypertension, BP is not at goal, elevated. Initial blood pressure on presentation, SBP 204 Recently started on telmisartan 20 mg daily outpatient Resume home oral antihypertensives when able Currently on nitro drip Closely monitor vital signs  Outpatient diagnosis of UTI, POA Prior to admission was on antibiotics from 01/08/2024 for UTI, she does not recall the name of the antibiotics. Follow urine analysis and urine culture Pending home medications reconciliation.   Time: 75 minutes.    DVT prophylaxis: Heparin  drip.  Code Status: Full code.  Family Communication: Daughter and son-in-law at bedside.  Disposition Plan: Admitted to telemetry cardiac unit.  Consults called: Cardiology.  Admission status: Observation status.   Status is: Observation    Terry LOISE Hurst MD [facility] Pager [phone]  If 7PM-7AM, please contact night-coverage [email or web address] Password [facility]  01/12/2024, 11:22 PM

## 2024-01-13 ENCOUNTER — Observation Stay (HOSPITAL_COMMUNITY)

## 2024-01-13 DIAGNOSIS — I5032 Chronic diastolic (congestive) heart failure: Secondary | ICD-10-CM | POA: Diagnosis present

## 2024-01-13 DIAGNOSIS — R079 Chest pain, unspecified: Secondary | ICD-10-CM

## 2024-01-13 DIAGNOSIS — Z79899 Other long term (current) drug therapy: Secondary | ICD-10-CM | POA: Diagnosis not present

## 2024-01-13 DIAGNOSIS — I10 Essential (primary) hypertension: Secondary | ICD-10-CM | POA: Diagnosis not present

## 2024-01-13 DIAGNOSIS — I200 Unstable angina: Secondary | ICD-10-CM | POA: Diagnosis present

## 2024-01-13 DIAGNOSIS — Z8249 Family history of ischemic heart disease and other diseases of the circulatory system: Secondary | ICD-10-CM | POA: Diagnosis not present

## 2024-01-13 DIAGNOSIS — E66811 Obesity, class 1: Secondary | ICD-10-CM | POA: Diagnosis present

## 2024-01-13 DIAGNOSIS — E871 Hypo-osmolality and hyponatremia: Secondary | ICD-10-CM | POA: Diagnosis present

## 2024-01-13 DIAGNOSIS — Z823 Family history of stroke: Secondary | ICD-10-CM | POA: Diagnosis not present

## 2024-01-13 DIAGNOSIS — Z87891 Personal history of nicotine dependence: Secondary | ICD-10-CM | POA: Diagnosis not present

## 2024-01-13 DIAGNOSIS — I214 Non-ST elevation (NSTEMI) myocardial infarction: Secondary | ICD-10-CM | POA: Insufficient documentation

## 2024-01-13 DIAGNOSIS — I739 Peripheral vascular disease, unspecified: Secondary | ICD-10-CM | POA: Diagnosis present

## 2024-01-13 DIAGNOSIS — I110 Hypertensive heart disease with heart failure: Secondary | ICD-10-CM | POA: Diagnosis present

## 2024-01-13 DIAGNOSIS — Z716 Tobacco abuse counseling: Secondary | ICD-10-CM | POA: Diagnosis not present

## 2024-01-13 DIAGNOSIS — Z880 Allergy status to penicillin: Secondary | ICD-10-CM | POA: Diagnosis not present

## 2024-01-13 DIAGNOSIS — E785 Hyperlipidemia, unspecified: Secondary | ICD-10-CM

## 2024-01-13 DIAGNOSIS — E876 Hypokalemia: Secondary | ICD-10-CM

## 2024-01-13 DIAGNOSIS — Z6830 Body mass index (BMI) 30.0-30.9, adult: Secondary | ICD-10-CM | POA: Diagnosis not present

## 2024-01-13 DIAGNOSIS — F17200 Nicotine dependence, unspecified, uncomplicated: Secondary | ICD-10-CM

## 2024-01-13 DIAGNOSIS — I160 Hypertensive urgency: Secondary | ICD-10-CM | POA: Diagnosis present

## 2024-01-13 DIAGNOSIS — Z7982 Long term (current) use of aspirin: Secondary | ICD-10-CM | POA: Diagnosis not present

## 2024-01-13 DIAGNOSIS — I25110 Atherosclerotic heart disease of native coronary artery with unstable angina pectoris: Secondary | ICD-10-CM | POA: Diagnosis present

## 2024-01-13 DIAGNOSIS — Z7902 Long term (current) use of antithrombotics/antiplatelets: Secondary | ICD-10-CM | POA: Diagnosis not present

## 2024-01-13 DIAGNOSIS — N390 Urinary tract infection, site not specified: Secondary | ICD-10-CM | POA: Diagnosis present

## 2024-01-13 HISTORY — PX: TRANSTHORACIC ECHOCARDIOGRAM: SHX275

## 2024-01-13 LAB — URINALYSIS, W/ REFLEX TO CULTURE (INFECTION SUSPECTED)
Bilirubin Urine: NEGATIVE
Glucose, UA: NEGATIVE mg/dL
Hgb urine dipstick: NEGATIVE
Ketones, ur: NEGATIVE mg/dL
Nitrite: NEGATIVE
Protein, ur: NEGATIVE mg/dL
Specific Gravity, Urine: 1.009 (ref 1.005–1.030)
pH: 5 (ref 5.0–8.0)

## 2024-01-13 LAB — LIPID PANEL
Cholesterol: 135 mg/dL (ref 0–200)
HDL: 39 mg/dL — ABNORMAL LOW (ref >40)
LDL Cholesterol: 77 mg/dL (ref 0–99)
Total CHOL/HDL Ratio: 3.5 ratio
Triglycerides: 97 mg/dL (ref <150)
VLDL: 19 mg/dL (ref 0–40)

## 2024-01-13 LAB — CBC
HCT: 35.8 % — ABNORMAL LOW (ref 36.0–46.0)
Hemoglobin: 11.9 g/dL — ABNORMAL LOW (ref 12.0–15.0)
MCH: 29.3 pg (ref 26.0–34.0)
MCHC: 33.2 g/dL (ref 30.0–36.0)
MCV: 88.2 fL (ref 80.0–100.0)
Platelets: 190 K/uL (ref 150–400)
RBC: 4.06 MIL/uL (ref 3.87–5.11)
RDW: 13.1 % (ref 11.5–15.5)
WBC: 5.6 K/uL (ref 4.0–10.5)
nRBC: 0 % (ref 0.0–0.2)

## 2024-01-13 LAB — ECHOCARDIOGRAM COMPLETE
Area-P 1/2: 2.44 cm2
Height: 62 in
S' Lateral: 2.5 cm
Weight: 2641.99 [oz_av]

## 2024-01-13 LAB — BASIC METABOLIC PANEL WITH GFR
Anion gap: 13 (ref 5–15)
BUN: 17 mg/dL (ref 8–23)
CO2: 20 mmol/L — ABNORMAL LOW (ref 22–32)
Calcium: 9 mg/dL (ref 8.9–10.3)
Chloride: 100 mmol/L (ref 98–111)
Creatinine, Ser: 0.71 mg/dL (ref 0.44–1.00)
GFR, Estimated: >60 mL/min (ref >60)
Glucose, Bld: 101 mg/dL — ABNORMAL HIGH (ref 70–99)
Potassium: 3.2 mmol/L — ABNORMAL LOW (ref 3.5–5.1)
Sodium: 133 mmol/L — ABNORMAL LOW (ref 135–145)

## 2024-01-13 LAB — PHOSPHORUS: Phosphorus: 3.6 mg/dL (ref 2.5–4.6)

## 2024-01-13 LAB — MAGNESIUM: Magnesium: 1.7 mg/dL (ref 1.7–2.4)

## 2024-01-13 LAB — HEPARIN LEVEL (UNFRACTIONATED)
Heparin Unfractionated: 0.32 [IU]/mL (ref 0.30–0.70)
Heparin Unfractionated: 0.25 [IU]/mL — ABNORMAL LOW (ref 0.30–0.70)

## 2024-01-13 MED ORDER — POTASSIUM CHLORIDE CRYS ER 20 MEQ PO TBCR
40.0000 meq | EXTENDED_RELEASE_TABLET | Freq: Once | ORAL | Status: AC
  Administered 2024-01-13: 40 meq via ORAL
  Filled 2024-01-13: qty 2

## 2024-01-13 MED ORDER — ROSUVASTATIN CALCIUM 20 MG PO TABS
40.0000 mg | ORAL_TABLET | Freq: Every day | ORAL | Status: DC
  Administered 2024-01-14: 40 mg via ORAL
  Filled 2024-01-13 (×2): qty 2

## 2024-01-13 MED ORDER — SODIUM CHLORIDE 0.9 % IV SOLN: 1.0000 g | INTRAVENOUS | Status: DC

## 2024-01-13 MED ORDER — ROSUVASTATIN CALCIUM 5 MG PO TABS
5.0000 mg | ORAL_TABLET | Freq: Every day | ORAL | Status: DC
  Administered 2024-01-13: 5 mg via ORAL
  Filled 2024-01-13: qty 1

## 2024-01-13 MED ORDER — METOPROLOL TARTRATE 12.5 MG HALF TABLET
12.5000 mg | ORAL_TABLET | Freq: Two times a day (BID) | ORAL | Status: DC
  Administered 2024-01-13 – 2024-01-16 (×5): 12.5 mg via ORAL
  Filled 2024-01-13 (×5): qty 1

## 2024-01-13 MED ORDER — ASPIRIN 81 MG PO TBEC
81.0000 mg | DELAYED_RELEASE_TABLET | Freq: Every day | ORAL | Status: DC
  Administered 2024-01-13 – 2024-01-15 (×3): 81 mg via ORAL
  Filled 2024-01-13 (×3): qty 1

## 2024-01-13 NOTE — Plan of Care (Signed)
  Problem: Clinical Measurements: Goal: Will remain free from infection Outcome: Progressing Goal: Respiratory complications will improve Outcome: Progressing Goal: Cardiovascular complication will be avoided Outcome: Progressing   Problem: Pain Managment: Goal: General experience of comfort will improve and/or be controlled Outcome: Progressing   Problem: Safety: Goal: Ability to remain free from injury will improve Outcome: Progressing   Problem: Skin Integrity: Goal: Risk for impaired skin integrity will decrease Outcome: Progressing

## 2024-01-13 NOTE — Assessment & Plan Note (Signed)
 Calculated BMI is 30.2

## 2024-01-13 NOTE — Assessment & Plan Note (Signed)
 Continue blood pressure control with metoprolol.

## 2024-01-13 NOTE — Hospital Course (Signed)
 Mrs. Schelling was admitted to the hospital with the working diagnosis of acute coronary syndrome.   73 yo female with the past medical history of hypertension, hyperlipidemia, coronary artery disease, and obesity who presented with chest pain. Reported precordial chest pain, severe in intensity, constant, not improved with rest. She called EMS, she received aspirin  and was transported to the ED.  On her initial physical examination her blood pressure was 123/57, HR 77, RR 20 and 02 saturation 96% on room air.  Lungs with no rales or wheezing, heart with S1 and S2 present and regular, abdomen with no distention or tenderness and no lower extremity edema.   Na 135, K 4,3 Cl 101 bicarbonate 24 glucose 93 bun 15 cr 0,82  High sensitive troponin 4 and 5  Wbc 5,5 hgb 13,9 plt 208  Urine analysis SG 1,009, protein negative, trace leukocytes.   Chest radiograph with hyperinflation with no cardiomegaly, no effusions or infiltrates.   EKG 76 bpm, normal axis, normal intervals, qtc 427, sinus rhythm with small q wave lead I and aVL, no significant ST segment or T wave changes.   07/27 plan for cardiac catheterization tomorrow.  07/28 stent placed in the LAD lesion.  07/29 no further chest pain, patient will go home and will follow up as outpatient.

## 2024-01-13 NOTE — Progress Notes (Addendum)
 PHARMACY - ANTICOAGULATION CONSULT NOTE  Pharmacy Consult for heparin  Indication: chest pain/ACS  Allergies  Allergen Reactions   Penicillins Other (See Comments)    Pass out   Has patient had a PCN reaction causing immediate rash, facial/tongue/throat swelling, SOB or lightheadedness with hypotension:YES Has patient had a PCN reaction causing severe rash involving mucus membranes or skin necrosis:No Has patient had a PCN reaction that required hospitalization:NO Has patient had a PCN reaction occurring within the last 10 years:NO If all of the above answers are NO, then may proceed with Cephalosporin use.     Patient Measurements: Height: 5' 2 (157.5 cm) Weight: 74.9 kg (165 lb 2 oz) IBW/kg (Calculated) : 50.1 HEPARIN  DW (KG): 65.6  Vital Signs: Temp: 98 F (36.7 C) (07/26 0545) Temp Source: Oral (07/26 0545) BP: 136/67 (07/26 0545) Pulse Rate: 63 (07/26 0545)  Labs: Recent Labs    01/12/24 1722 01/12/24 2027 01/13/24 0301 01/13/24 0740  HGB 13.9  --  11.9*  --   HCT 41.3  --  35.8*  --   PLT 208  --  190  --   HEPARINUNFRC  --   --   --  0.32  CREATININE 0.82  --  0.71  --   TROPONINIHS 4 5  --   --     Estimated Creatinine Clearance: 57.6 mL/min (by C-G formula based on SCr of 0.71 mg/dL).   Medical History: Past Medical History:  Diagnosis Date   Arthritis    right ankle, wears brace prn   Hypertension    Pain in head    occasional pain back of head on right for last couple of years, to see neurlogist for      Assessment: 3 YOF presenting with CP, hx of CAD, she is not on anticoagulation PTA, CBC wnl  07/26 AM update: Heparin  level therapeutic at 0.32, no s/sx of bleeding per chart review. Hgb 11.9, Hct 35.8, Plts 190. Will increase rate slightly to maintain therapeutic goal.   Goal of Therapy:  Heparin  level 0.3-0.7 units/ml Monitor platelets by anticoagulation protocol: Yes   Plan:  Will increase heparin  gtt to 850 units/hr F/u 8 hour  heparin  level Daily CBC and heparin  level  F/u cards eval and recs  R. Samual Satterfield, PharmD PGY-1 Acute Care Pharmacy Resident [facility] 01/13/2024 8:34 AM

## 2024-01-13 NOTE — Consult Note (Signed)
 Reason for Consult: Chest pain/abnormal coronary CT angio Referring Physician: [facility] hospitalist  Jaclyn Golden is an 73 y.o. female.  HPI: Patient is 73 years old female with past medical history significant for hypertension, hyperlipidemia, 50+ pack years of tobacco abuse, quit approximately 3 years ago, strong family history of coronary artery disease 5 brothers had MI in their 65s and 5s, complained of generalized weakness for last 6 to 8 months and subsequently underwent CT angio with FFR which showed critical physiologically significant mid LAD stenosis with FFR of 0.7, and mild left circumflex and RCA disease.  Patient complained of vague left-sided chest pain associated with feeling tired and fatigued so decided to come to the ED yesterday.  EKG done in the ED showed normal sinus rhythm with no acute ischemic changes 2 sets of troponin were negative states has not seen any cardiologist in the past.  Also complains of vague claudication pain in the legs.  Patient denies PND orthopnea and leg swelling denies palpitation lightheadedness or syncope denies any chest pain at present.  Past Medical History:  Diagnosis Date   Arthritis    right ankle, wears brace prn   Hypertension    Pain in head    occasional pain back of head on right for last couple of years, to see neurlogist for     Past Surgical History:  Procedure Laterality Date   APPENDECTOMY  1980's   ruptured   BREAST BIOPSY Left 1980   CATARACT EXTRACTION     COLONOSCOPY WITH PROPOFOL  N/A 02/11/2014   Procedure: COLONOSCOPY WITH PROPOFOL ;  Surgeon: Gladis MARLA Louder, MD;  Location: [facility];  Service: Endoscopy;  Laterality: N/A;   EYE SURGERY     TUBAL LIGATION  1979    Family History  Problem Relation Age of Onset   Stroke Mother    Anuerysm Brother    Heart failure Brother    Cancer Father     Social History:  reports that she quit smoking about 3 years ago. Her smoking use included cigarettes. She started  smoking about 50 years ago. She has a 47 pack-year smoking history. She has never used smokeless tobacco. She reports that she does not drink alcohol and does not use drugs.  Allergies:  Allergies  Allergen Reactions   Penicillins Other (See Comments)    Pass out   Has patient had a PCN reaction causing immediate rash, facial/tongue/throat swelling, SOB or lightheadedness with hypotension:YES Has patient had a PCN reaction causing severe rash involving mucus membranes or skin necrosis:No Has patient had a PCN reaction that required hospitalization:NO Has patient had a PCN reaction occurring within the last 10 years:NO If all of the above answers are NO, then may proceed with Cephalosporin use.     Medications: I have reviewed the patient's current medications.  Results for orders placed or performed during the hospital encounter of 01/12/24 (from the past 48 hours)  Basic metabolic panel     Status: None   Collection Time: 01/12/24  5:22 PM  Result Value Ref Range   Sodium 135 135 - 145 mmol/L   Potassium 4.3 3.5 - 5.1 mmol/L   Chloride 101 98 - 111 mmol/L   CO2 24 22 - 32 mmol/L   Glucose, Bld 93 70 - 99 mg/dL    Comment: Glucose reference range applies only to samples taken after fasting for at least 8 hours.   BUN 15 8 - 23 mg/dL   Creatinine, Ser 9.17 0.44 - 1.00  mg/dL   Calcium  9.2 8.9 - 10.3 mg/dL   GFR, Estimated >39 >39 mL/min    Comment: (NOTE) Calculated using the CKD-EPI Creatinine Equation (2021)    Anion gap 10 5 - 15    Comment: Performed at [facility], [address], [place], [place] [address]  CBC     Status: None   Collection Time: 01/12/24  5:22 PM  Result Value Ref Range   WBC 5.5 4.0 - 10.5 K/uL   RBC 4.78 3.87 - 5.11 MIL/uL   Hemoglobin 13.9 12.0 - 15.0 g/dL   HCT 58.6 63.9 - 53.9 %   MCV 86.4 80.0 - 100.0 fL   MCH 29.1 26.0 - 34.0 pg   MCHC 33.7 30.0 - 36.0 g/dL   RDW 86.9 88.4 - 84.4 %   Platelets 208 150 - 400 K/uL   nRBC 0.0 0.0 -  0.2 %    Comment: Performed at [facility], [address] [place], [place] [address]  Troponin I (High Sensitivity)     Status: None   Collection Time: 01/12/24  5:22 PM  Result Value Ref Range   Troponin I (High Sensitivity) 4 <18 ng/L    Comment: (NOTE) Elevated high sensitivity troponin I (hsTnI) values and significant  changes across serial measurements may suggest ACS but many other  chronic and acute conditions are known to elevate hsTnI results.  Refer to the Links section for chest pain algorithms and additional  guidance. Performed at [facility], [address] [place], [place] [address]   Troponin I (High Sensitivity)     Status: None   Collection Time: 01/12/24  8:27 PM  Result Value Ref Range   Troponin I (High Sensitivity) 5 <18 ng/L    Comment: (NOTE) Elevated high sensitivity troponin I (hsTnI) values and significant  changes across serial measurements may suggest ACS but many other  chronic and acute conditions are known to elevate hsTnI results.  Refer to the Links section for chest pain algorithms and additional  guidance. Performed at [facility], [address] [place], [place] [address]   CBC     Status: Abnormal   Collection Time: 01/13/24  3:01 AM  Result Value Ref Range   WBC 5.6 4.0 - 10.5 K/uL   RBC 4.06 3.87 - 5.11 MIL/uL   Hemoglobin 11.9 (L) 12.0 - 15.0 g/dL   HCT 64.1 (L) 63.9 - 53.9 %   MCV 88.2 80.0 - 100.0 fL   MCH 29.3 26.0 - 34.0 pg   MCHC 33.2 30.0 - 36.0 g/dL   RDW 86.8 88.4 - 84.4 %   Platelets 190 150 - 400 K/uL   nRBC 0.0 0.0 - 0.2 %    Comment: Performed at [facility], [address], [place], [place] [address]  Lipid panel     Status: Abnormal   Collection Time: 01/13/24  3:01 AM  Result Value Ref Range   Cholesterol 135 0 - 200 mg/dL   Triglycerides 97 <849 mg/dL   HDL 39 (L) >59 mg/dL   Total CHOL/HDL Ratio 3.5 RATIO   VLDL 19 0 - 40 mg/dL   LDL Cholesterol 77 0 - 99 mg/dL    Comment:         Total Cholesterol/HDL:CHD Risk Coronary Heart Disease Risk Table                     Men   Women  1/2  Average Risk   3.4   3.3  Average Risk       5.0   4.4  2 X Average Risk   9.6   7.1  3 X Average Risk  23.4   11.0        Use the calculated Patient Ratio above and the CHD Risk Table to determine the patient's CHD Risk.        ATP III CLASSIFICATION (LDL):  <100     mg/dL   Optimal  899-870  mg/dL   Near or Above                    Optimal  130-159  mg/dL   Borderline  839-810  mg/dL   High  >809     mg/dL   Very High Performed at [facility], [address] [place], [place] [address]   Basic metabolic panel     Status: Abnormal   Collection Time: 01/13/24  3:01 AM  Result Value Ref Range   Sodium 133 (L) 135 - 145 mmol/L   Potassium 3.2 (L) 3.5 - 5.1 mmol/L   Chloride 100 98 - 111 mmol/L   CO2 20 (L) 22 - 32 mmol/L   Glucose, Bld 101 (H) 70 - 99 mg/dL    Comment: Glucose reference range applies only to samples taken after fasting for at least 8 hours.   BUN 17 8 - 23 mg/dL   Creatinine, Ser 9.28 0.44 - 1.00 mg/dL   Calcium  9.0 8.9 - 10.3 mg/dL   GFR, Estimated >39 >39 mL/min    Comment: (NOTE) Calculated using the CKD-EPI Creatinine Equation (2021)    Anion gap 13 5 - 15    Comment: Performed at [facility], [address], [place], [place] [address]  Magnesium     Status: None   Collection Time: 01/13/24  3:01 AM  Result Value Ref Range   Magnesium 1.7 1.7 - 2.4 mg/dL    Comment: Performed at [facility], [address] [place], [place] [address]  Phosphorus     Status: None   Collection Time: 01/13/24  3:01 AM  Result Value Ref Range   Phosphorus 3.6 2.5 - 4.6 mg/dL    Comment: Performed at [facility], [address], [place], [place] [address]  Urinalysis, w/ Reflex to Culture (Infection Suspected) -Urine, Clean Catch     Status: Abnormal   Collection Time: 01/13/24  5:46 AM  Result Value Ref Range   Specimen Source  URINE, CLEAN CATCH    Color, Urine YELLOW YELLOW   APPearance CLEAR CLEAR   Specific Gravity, Urine 1.009 1.005 - 1.030   pH 5.0 5.0 - 8.0   Glucose, UA NEGATIVE NEGATIVE mg/dL   Hgb urine dipstick NEGATIVE NEGATIVE   Bilirubin Urine NEGATIVE NEGATIVE   Ketones, ur NEGATIVE NEGATIVE mg/dL   Protein, ur NEGATIVE NEGATIVE mg/dL   Nitrite NEGATIVE NEGATIVE   Leukocytes,Ua TRACE (A) NEGATIVE   RBC / HPF 0-5 0 - 5 RBC/hpf   WBC, UA 0-5 0 - 5 WBC/hpf    Comment:        Reflex urine culture not performed if WBC <=10, OR if Squamous epithelial cells >5. If Squamous epithelial cells >5 suggest recollection.    Bacteria, UA RARE (A) NONE SEEN   Squamous Epithelial / HPF 0-5 0 - 5 /HPF   Mucus PRESENT    Hyaline Casts, UA PRESENT  Comment: Performed at [facility], [address] [place], [place] [address]    DG Chest 2 View Result Date: 01/12/2024 CLINICAL DATA:  Chest pain EXAM: CHEST - 2 VIEW COMPARISON:  Chest x-ray 10/29/2019.  Chest CT 12/11/2023. FINDINGS: The heart size and mediastinal contours are within normal limits. Both lungs are clear. The visualized skeletal structures are unremarkable. IMPRESSION: No active cardiopulmonary disease. Electronically Signed   By: Greig Pique M.D.   On: 01/12/2024 18:13   CT CORONARY FRACTIONAL FLOW RESERVE FLUID ANALYSIS Result Date: 01/11/2024 EXAM: FFRCT ANALYSIS for abnormal coronary CT-A. FINDINGS: FFRct analysis was performed on the original cardiac CT angiogram dataset. Diagrammatic representation of the FFRct analysis is provided in a separate PDF document in PACS. This dictation was created using the PDF document and an interactive 3D model of the results. 3D model is not available in the EMR/PACS. Normal FFR range is >0.80. 1. Left Main: FFRct 0.95 2. LAD: FFRct 0.92 proximal, 0.7 mid. 3. LCX: FFRct 0.95 proximal, 0.91 mid., 0.87 distal.  OM1 FFRct 0.89 4. RCA: FFRct 0.98 proximal, 0.95 mid, 0.94 distal. IMPRESSION: 1. FFRct  findings are consistent with severe stenosis in the mid LAD. 2.  Recommend cardiac catheterization. Tiffany C. Raford, MD Electronically Signed   By: Annabella Raford M.D.   On: 01/11/2024 13:51    Review of Systems  Constitutional:  Positive for fatigue. Negative for diaphoresis.  HENT:  Negative for congestion.   Respiratory:  Positive for shortness of breath.   Cardiovascular:  Positive for chest pain. Negative for palpitations and leg swelling.  Gastrointestinal:  Negative for abdominal pain.  Neurological:  Negative for dizziness.   Blood pressure 136/67, pulse 63, temperature 98 F (36.7 C), temperature source Oral, resp. rate 20, height 5' 2 (1.575 m), weight 74.9 kg, SpO2 96%. Physical Exam Constitutional:      Appearance: She is well-developed.  HENT:     Head: Normocephalic and atraumatic.  Eyes:     Extraocular Movements: Extraocular movements intact.     Pupils: Pupils are equal, round, and reactive to light.  Cardiovascular:     Rate and Rhythm: Normal rate and regular rhythm.     Heart sounds: Murmur heard.     Systolic murmur is present with a grade of 2/6.     Gallop present. S4 sounds present. No S3 sounds.  Pulmonary:     Effort: Pulmonary effort is normal.     Breath sounds: Normal breath sounds.  Musculoskeletal:        General: Normal range of motion.     Cervical back: Normal range of motion and neck supple.     Right lower leg: No tenderness. No edema.     Left lower leg: No tenderness. No edema.  Skin:    General: Skin is warm and dry.  Neurological:     General: No focal deficit present.     Mental Status: She is alert and oriented to person, place, and time.     Assessment/Plan: Unstable angina MI ruled out Critical mid LAD physiologically significant stenosis by CT angio with positive FFR Hypertension Hyperlipidemia Strong family history of coronary artery disease History of tobacco abuse Hypokalemia Plan Agree with IV heparin  and  nitrates Increase rosuvastatin  to 40 mg daily Add low-dose beta-blockers Replace K Will get interventional cardiology consult for possible PCI on Monday  Levern Hutching 01/13/2024, 8:06 AM

## 2024-01-13 NOTE — Progress Notes (Signed)
  Echocardiogram 2D Echocardiogram has been performed.  Jaclyn Golden 01/13/2024, 8:39 AM

## 2024-01-13 NOTE — Assessment & Plan Note (Signed)
 Continue statin therapy.

## 2024-01-13 NOTE — Assessment & Plan Note (Signed)
Smoking cessation counseling 

## 2024-01-13 NOTE — Assessment & Plan Note (Signed)
 Hyponatremia.   Renal function with serum cr at 1,0 with K at 5,5 and serum bicarbonate at 19  Na 134  Plan to repeat K today, possible hemolysis of sample.  Follow up renal function in am.  Avoid hypotension

## 2024-01-13 NOTE — Assessment & Plan Note (Signed)
 Coronary CT with severe stenosis of the mid LAD.  07/28 cardiac catheterization with culprit lesion mid LAD 40% stenosed, mid LAD 2 lesion 70% stenosed with 20% stenosed side branch in 2nd diagonal Drug eluding stent placed.   Continue medical therapy with aspirin  and clopidogrel  for 6 months, then ok to continue clopidogrel  alone for 1 to 2 years.   Continue with metoprolol  and statin.

## 2024-01-13 NOTE — Progress Notes (Signed)
 PHARMACY - ANTICOAGULATION CONSULT NOTE  Pharmacy Consult for Heparin  Indication: chest pain/ACS  Allergies  Allergen Reactions   Penicillins Other (See Comments)    Pass out   Has patient had a PCN reaction causing immediate rash, facial/tongue/throat swelling, SOB or lightheadedness with hypotension:YES Has patient had a PCN reaction causing severe rash involving mucus membranes or skin necrosis:No Has patient had a PCN reaction that required hospitalization:NO Has patient had a PCN reaction occurring within the last 10 years:NO If all of the above answers are NO, then may proceed with Cephalosporin use.     Patient Measurements: Height: 5' 2 (157.5 cm) Weight: 74.9 kg (165 lb 2 oz) IBW/kg (Calculated) : 50.1 HEPARIN  DW (KG): 65.6  Vital Signs: Temp: 97.9 F (36.6 C) (07/26 1637) Temp Source: Oral (07/26 1637) BP: 109/55 (07/26 1637) Pulse Rate: 63 (07/26 1500)  Labs: Recent Labs    01/12/24 1722 01/12/24 2027 01/13/24 0301 01/13/24 0740 01/13/24 1749  HGB 13.9  --  11.9*  --   --   HCT 41.3  --  35.8*  --   --   PLT 208  --  190  --   --   HEPARINUNFRC  --   --   --  0.32 0.25*  CREATININE 0.82  --  0.71  --   --   TROPONINIHS 4 5  --   --   --     Estimated Creatinine Clearance: 57.6 mL/min (by C-G formula based on SCr of 0.71 mg/dL).   Medical History: Past Medical History:  Diagnosis Date   Arthritis    right ankle, wears brace prn   Hypertension    Pain in head    occasional pain back of head on right for last couple of years, to see neurlogist for     Assessment: 34 YOF presenting with CP, hx of CAD, she is not on anticoagulation PTA, CBC wnl   Heparin  level is subtherapeutic (0.25) on 850 units/hr.  Trended down after increase rate from 800 units/hr earlier today, when heparin  level was low therapeutic (0.30).  Goal of Therapy:  Heparin  level 0.3-0.7 units/ml Monitor platelets by anticoagulation protocol: Yes   Plan:  Increase heparin   drip to 950 units/hr Next heparin  level and CBC in am. Noted plan for cardiac cath on Monday 01/15/24.   Genaro Zebedee Calin, RPh 01/13/2024,7:53 PM

## 2024-01-13 NOTE — Progress Notes (Signed)
  Progress Note   Patient: Jaclyn Golden FMW:[id number] DOB: [birth year] DOA: 01/12/2024     0 DOS: the patient was seen and examined on 01/13/2024   Brief hospital course: Jaclyn Golden was admitted to the hospital with the working diagnosis of acute coronary syndrome.   73 yo female with the past medical history of hypertension, hyperlipidemia, coronary artery disease, and obesity who presented with chest pain. Reported precordial chest pain, severe in intensity, constant, not improved with rest. She called EMS, she received aspirin  and was transported to the ED.  On her initial physical examination her blood pressure was 123/57, HR 77, RR 20 and 02 saturation 96% on room air.  Lungs with no rales or wheezing, heart with S1 and S2 present and regular, abdomen with no distention or tenderness and no lower extremity edema.   Na 135, K 4,3 Cl 101 bicarbonate 24 glucose 93 bun 15 cr 0,82  High sensitive troponin 4 and 5  Wbc 5,5 hgb 13,9 plt 208  Urine analysis SG 1,009, protein negative, trace leukocytes.   Chest radiograph with hyperinflation with no cardiomegaly, no effusions or infiltrates.   EKG 76 bpm, normal axis, normal intervals, qtc 427, sinus rhythm with small q wave lead I and aVL, no significant ST segment or T wave changes.    Assessment and Plan: * Unstable angina (HCC) Coronary CT with severe stenosis of the mid LAD.   Continue medical therapy with aspirin , metoprolol  and statin.  Anticoagulation with IV heparin .   Plan for cardiac catheterization on this hospitalization.   Essential (primary) hypertension Continue blood pressure control with metoprolol .   HLD (hyperlipidemia) Continue statin therapy   Hypokalemia Hyponatremia.   Continue K correction with Kcl 40 meq x 2 doses, follow up renal function and electrolytes in am.   Obesity, class 1 Calculated BMI is 30.2   Tobacco dependence Smoking cessation counseling         Subjective: Patient today with  no chest pain or dyspnea, no nausea or vomiting.   Physical Exam: Vitals:   01/13/24 0100 01/13/24 0545 01/13/24 0920 01/13/24 1500  BP: (!) 123/57 136/67 (!) 138/99 (!) 116/48  Pulse: 69 63 66 63  Resp: 20 20 20 16   Temp: 97.7 F (36.5 C) 98 F (36.7 C) (!) 97.4 F (36.3 C) 98.2 F (36.8 C)  TempSrc: Oral Oral Oral Oral  SpO2: 96% 96% 98%   Weight:  74.9 kg    Height:       Neurology awake and alert ENT with no pallor Cardiovascular with S1 and S2 present and regular with no gallops, rubs or murmurs Respiratory with no rales or wheezing, no rhonchi  Abdomen with no distention  No lower extremity edema,   Data Reviewed:    Family Communication: no family at the bedside   Disposition: Status is: Inpatient Remains inpatient appropriate because: cardiac work up   Planned Discharge Destination: Home     Author: Elidia Toribio Furnace, MD 01/13/2024 4:33 PM  For on call review [email or web address].

## 2024-01-13 NOTE — Progress Notes (Addendum)
 Interventional consult requested to [facility] by Dr. Levern for cath on Monday. D/w Dr. Mona -> will have interventional team see on Monday for final plans. Msg sent to [employer] inbox to help facilitate. I asked Dr. Levern to make patient NPO after MN going into Monday. Final orders will be placed at time of interventional consult. In the interim, cardiology care per Dr. Levern.

## 2024-01-14 DIAGNOSIS — I10 Essential (primary) hypertension: Secondary | ICD-10-CM | POA: Diagnosis not present

## 2024-01-14 DIAGNOSIS — I200 Unstable angina: Secondary | ICD-10-CM | POA: Diagnosis not present

## 2024-01-14 DIAGNOSIS — E876 Hypokalemia: Secondary | ICD-10-CM | POA: Diagnosis not present

## 2024-01-14 DIAGNOSIS — E785 Hyperlipidemia, unspecified: Secondary | ICD-10-CM | POA: Diagnosis not present

## 2024-01-14 LAB — CBC
HCT: 36.9 % (ref 36.0–46.0)
Hemoglobin: 12.1 g/dL (ref 12.0–15.0)
MCH: 28.9 pg (ref 26.0–34.0)
MCHC: 32.8 g/dL (ref 30.0–36.0)
MCV: 88.1 fL (ref 80.0–100.0)
Platelets: 170 K/uL (ref 150–400)
RBC: 4.19 MIL/uL (ref 3.87–5.11)
RDW: 13.4 % (ref 11.5–15.5)
WBC: 4.9 K/uL (ref 4.0–10.5)
nRBC: 0 % (ref 0.0–0.2)

## 2024-01-14 LAB — BASIC METABOLIC PANEL WITH GFR
Anion gap: 10 (ref 5–15)
BUN: 16 mg/dL (ref 8–23)
CO2: 19 mmol/L — ABNORMAL LOW (ref 22–32)
Calcium: 9.1 mg/dL (ref 8.9–10.3)
Chloride: 105 mmol/L (ref 98–111)
Creatinine, Ser: 1.02 mg/dL — ABNORMAL HIGH (ref 0.44–1.00)
GFR, Estimated: 57 mL/min — ABNORMAL LOW (ref >60)
Glucose, Bld: 88 mg/dL (ref 70–99)
Potassium: 5.5 mmol/L — ABNORMAL HIGH (ref 3.5–5.1)
Sodium: 134 mmol/L — ABNORMAL LOW (ref 135–145)

## 2024-01-14 LAB — HEPARIN LEVEL (UNFRACTIONATED): Heparin Unfractionated: 0.35 [IU]/mL (ref 0.30–0.70)

## 2024-01-14 LAB — POTASSIUM: Potassium: 4.3 mmol/L (ref 3.5–5.1)

## 2024-01-14 MED ORDER — CLOPIDOGREL BISULFATE 75 MG PO TABS
150.0000 mg | ORAL_TABLET | Freq: Once | ORAL | Status: AC
  Administered 2024-01-14: 150 mg via ORAL
  Filled 2024-01-14: qty 2

## 2024-01-14 NOTE — Plan of Care (Signed)
  Problem: Education: Goal: Knowledge of General Education information will improve Description: Including pain rating scale, medication(s)/side effects and non-pharmacologic comfort measures Outcome: Progressing   Problem: Health Behavior/Discharge Planning: Goal: Ability to manage health-related needs will improve Outcome: Progressing   Problem: Clinical Measurements: Goal: Will remain free from infection Outcome: Progressing Goal: Respiratory complications will improve Outcome: Progressing Goal: Cardiovascular complication will be avoided Outcome: Progressing   Problem: Activity: Goal: Risk for activity intolerance will decrease Outcome: Progressing   Problem: Elimination: Goal: Will not experience complications related to urinary retention Outcome: Progressing   Problem: Pain Managment: Goal: General experience of comfort will improve and/or be controlled Outcome: Progressing   Problem: Safety: Goal: Ability to remain free from injury will improve Outcome: Progressing

## 2024-01-14 NOTE — H&P (View-Only) (Signed)
 Subjective:  Doing well denies any chest pain or shortness of breath.  Spoke  medical group cardiology patient will be scheduled for left cardiac cath possible PCI in a.m. will keep patient n.p.o. after midnight  Objective:  Vital Signs in the last 24 hours: Temp:  [97.5 F (36.4 C)-98.4 F (36.9 C)] 97.5 F (36.4 C) (07/27 0802) Pulse Rate:  [58-66] 58 (07/27 0802) Resp:  [16-19] 18 (07/27 0802) BP: (109-129)/(48-69) 111/65 (07/27 0802) SpO2:  [95 %-99 %] 95 % (07/27 0802) Weight:  [75 kg] 75 kg (07/27 0531)  Intake/Output from previous day: 07/26 0701 - 07/27 0700 In: 505 [P.O.:240; I.V.:265] Out: 600 [Urine:600] Intake/Output from this shift: Total I/O In: 11.5 [I.V.:11.5] Out: -   Physical Exam: Exam unchanged  Lab Results: Recent Labs    01/13/24 0301 01/14/24 0224  WBC 5.6 4.9  HGB 11.9* 12.1  PLT 190 170   Recent Labs    01/13/24 0301 01/14/24 0224  NA 133* 134*  K 3.2* 5.5*  CL 100 105  CO2 20* 19*  GLUCOSE 101* 88  BUN 17 16  CREATININE 0.71 1.02*   No results for input(s): TROPONINI in the last 72 hours.  Invalid input(s): CK, MB Hepatic Function Panel No results for input(s): PROT, ALBUMIN, AST, ALT, ALKPHOS, BILITOT, BILIDIR, IBILI in the last 72 hours. Recent Labs    01/13/24 0301  CHOL 135   No results for input(s): PROTIME in the last 72 hours.  Imaging: Imaging results have been reviewed and ECHOCARDIOGRAM COMPLETE Result Date: 01/13/2024    ECHOCARDIOGRAM REPORT   Patient Name:   Jaclyn Golden Date of Exam: 01/13/2024 Medical Rec #:  [id number]      Height:       62.0 in Accession #:    [id number]     Weight:       165.1 lb Date of Birth:  03/27/1951      BSA:          1.762 m Patient Age:    73 years       BP:           136/67 mmHg Patient Gender: F              HR:           58 bpm. Exam Location:  Inpatient Procedure: 2D Echo (Both Spectral and Color Flow Doppler were utilized during             procedure). Indications:    chest pain  History:        Patient has prior history of Echocardiogram examinations, most                 recent 10/29/2019. Risk Factors:Former Smoker, Dyslipidemia and                 Hypertension.  Sonographer:    Tinnie Barefoot RDCS Referring Phys: [id number] CAROLE N HALL IMPRESSIONS  1. Left ventricular ejection fraction, by estimation, is 65 to 70%. The left ventricle has normal function. The left ventricle has no regional wall motion abnormalities. Left ventricular diastolic parameters are indeterminate.  2. Right ventricular systolic function is normal. The right ventricular size is normal. There is normal pulmonary artery systolic pressure. The estimated right ventricular systolic pressure is 33.5 mmHg.  3. The mitral valve is degenerative. Trivial mitral valve regurgitation. No evidence of mitral stenosis. Moderate mitral annular calcification.  4. The aortic valve is tricuspid. Aortic valve regurgitation is  not visualized. Aortic valve sclerosis/calcification is present, without any evidence of aortic stenosis.  5. The inferior vena cava is normal in size with greater than 50% respiratory variability, suggesting right atrial pressure of 3 mmHg. Comparison(s): No significant change from prior study. FINDINGS  Left Ventricle: Left ventricular ejection fraction, by estimation, is 65 to 70%. The left ventricle has normal function. The left ventricle has no regional wall motion abnormalities. The left ventricular internal cavity size was normal in size. There is  no left ventricular hypertrophy. Left ventricular diastolic function could not be evaluated due to mitral annular calcification (moderate or greater). Left ventricular diastolic parameters are indeterminate. Right Ventricle: The right ventricular size is normal. No increase in right ventricular wall thickness. Right ventricular systolic function is normal. There is normal pulmonary artery systolic pressure. The tricuspid  regurgitant velocity is 2.76 m/s, and  with an assumed right atrial pressure of 3 mmHg, the estimated right ventricular systolic pressure is 33.5 mmHg. Left Atrium: Left atrial size was normal in size. Right Atrium: Right atrial size was normal in size. Pericardium: There is no evidence of pericardial effusion. Presence of epicardial fat layer. Mitral Valve: The mitral valve is degenerative in appearance. There is mild calcification of the posterior mitral valve leaflet(s). Moderate mitral annular calcification. Trivial mitral valve regurgitation. No evidence of mitral valve stenosis. Tricuspid Valve: The tricuspid valve is grossly normal. Tricuspid valve regurgitation is mild . No evidence of tricuspid stenosis. Aortic Valve: The aortic valve is tricuspid. Aortic valve regurgitation is not visualized. Aortic valve sclerosis/calcification is present, without any evidence of aortic stenosis. Pulmonic Valve: The pulmonic valve was grossly normal. Pulmonic valve regurgitation is trivial. No evidence of pulmonic stenosis. Aorta: The aortic root and ascending aorta are structurally normal, with no evidence of dilitation. Venous: The right lower pulmonary vein is normal. The inferior vena cava is normal in size with greater than 50% respiratory variability, suggesting right atrial pressure of 3 mmHg. IAS/Shunts: The atrial septum is grossly normal.  LEFT VENTRICLE PLAX 2D LVIDd:         4.00 cm   Diastology LVIDs:         2.50 cm   LV e' medial:    5.44 cm/s LV PW:         0.90 cm   LV E/e' medial:  17.2 LV IVS:        0.90 cm   LV e' lateral:   6.64 cm/s LVOT diam:     1.70 cm   LV E/e' lateral: 14.1 LV SV:         55 LV SV Index:   31 LVOT Area:     2.27 cm  RIGHT VENTRICLE             IVC RV Basal diam:  2.70 cm     IVC diam: 1.70 cm RV S prime:     15.40 cm/s TAPSE (M-mode): 2.8 cm LEFT ATRIUM             Index        RIGHT ATRIUM           Index LA diam:        4.00 cm 2.27 cm/m   RA Area:     13.40 cm LA Vol  (A2C):   62.8 ml 35.64 ml/m  RA Volume:   33.40 ml  18.95 ml/m LA Vol (A4C):   53.8 ml 30.53 ml/m LA Biplane Vol: 59.0 ml 33.48 ml/m  AORTIC  VALVE LVOT Vmax:   98.80 cm/s LVOT Vmean:  64.300 cm/s LVOT VTI:    0.242 m  AORTA Ao Root diam: 2.80 cm Ao Asc diam:  3.00 cm MITRAL VALVE                TRICUSPID VALVE MV Area (PHT): 2.44 cm     TR Peak grad:   30.5 mmHg MV Decel Time: 311 msec     TR Vmax:        276.00 cm/s MV E velocity: 93.40 cm/s MV A velocity: 108.00 cm/s  SHUNTS MV E/A ratio:  0.86         Systemic VTI:  0.24 m                             Systemic Diam: 1.70 cm Darryle Decent MD Electronically signed by Darryle Decent MD Signature Date/Time: 01/13/2024/11:02:45 AM    Final    DG Chest 2 View Result Date: 01/12/2024 CLINICAL DATA:  Chest pain EXAM: CHEST - 2 VIEW COMPARISON:  Chest x-ray 10/29/2019.  Chest CT 12/11/2023. FINDINGS: The heart size and mediastinal contours are within normal limits. Both lungs are clear. The visualized skeletal structures are unremarkable. IMPRESSION: No active cardiopulmonary disease. Electronically Signed   By: Greig Pique M.D.   On: 01/12/2024 18:13    Cardiac Studies:  Assessment/Plan:  Unstable angina MI ruled out Critical mid LAD physiologically significant stenosis by CT angio with positive FFR Hypertension Hyperlipidemia Strong family history of coronary artery disease History of tobacco abuse Plan Add clopidogrel  as per orders N.p.o. after midnight Discussed with patient regarding left cardiac cath possible PTCA stenting its risk and benefits and consents for the procedure  LOS: 1 day    Jaclyn Golden 01/14/2024, 11:03 AM

## 2024-01-14 NOTE — Plan of Care (Signed)

## 2024-01-14 NOTE — Progress Notes (Addendum)
 PHARMACY - ANTICOAGULATION CONSULT NOTE  Pharmacy Consult for Heparin  Indication: chest pain/ACS  Allergies  Allergen Reactions   Penicillins Other (See Comments)    Pass out   Has patient had a PCN reaction causing immediate rash, facial/tongue/throat swelling, SOB or lightheadedness with hypotension:YES Has patient had a PCN reaction causing severe rash involving mucus membranes or skin necrosis:No Has patient had a PCN reaction that required hospitalization:NO Has patient had a PCN reaction occurring within the last 10 years:NO If all of the above answers are NO, then may proceed with Cephalosporin use.     Patient Measurements: Height: 5' 2 (157.5 cm) Weight: 75 kg (165 lb 5.5 oz) IBW/kg (Calculated) : 50.1 HEPARIN  DW (KG): 65.6  Vital Signs: Temp: 97.5 F (36.4 C) (07/27 1136) Temp Source: Oral (07/27 1136) BP: 158/83 (07/27 1136) Pulse Rate: 62 (07/27 1136)  Labs: Recent Labs    01/12/24 1722 01/12/24 2027 01/13/24 0301 01/13/24 0740 01/13/24 1749 01/14/24 0224  HGB 13.9  --  11.9*  --   --  12.1  HCT 41.3  --  35.8*  --   --  36.9  PLT 208  --  190  --   --  170  HEPARINUNFRC  --   --   --  0.32 0.25* 0.35  CREATININE 0.82  --  0.71  --   --  1.02*  TROPONINIHS 4 5  --   --   --   --     Estimated Creatinine Clearance: 45.2 mL/min (A) (by C-G formula based on SCr of 1.02 mg/dL (H)).   Medical History: Past Medical History:  Diagnosis Date   Arthritis    right ankle, wears brace prn   Hypertension    Pain in head    occasional pain back of head on right for last couple of years, to see neurlogist for     Assessment: 73 YOF presenting with CP, hx of CAD, she is not on anticoagulation PTA, CBC wnl   07/27 PM update: Heparin  therapeutic today at 0.35 at an infusion rate of 950 units/hr. Hgb 12.1, Hct 36.9, Plt 170. No s/sx of bleeding per chart review. Will increase heparin  to 1000 units/hr to target middle range of goal.  Goal of Therapy:   Heparin  level 0.3-0.7 units/ml Monitor platelets by anticoagulation protocol: Yes   Plan:  Increase heparin  drip to 1000 units/hr Next heparin  level and CBC in am. Noted plan for cardiac cath on Monday 01/15/24.   R. Samual Satterfield, PharmD PGY-1 Acute Care Pharmacy Resident [facility] 01/14/2024 3:26 PM

## 2024-01-14 NOTE — Progress Notes (Signed)
 Subjective:  Doing well denies any chest pain or shortness of breath.  Spoke  medical group cardiology patient will be scheduled for left cardiac cath possible PCI in a.m. will keep patient n.p.o. after midnight  Objective:  Vital Signs in the last 24 hours: Temp:  [97.5 F (36.4 C)-98.4 F (36.9 C)] 97.5 F (36.4 C) (07/27 0802) Pulse Rate:  [58-66] 58 (07/27 0802) Resp:  [16-19] 18 (07/27 0802) BP: (109-129)/(48-69) 111/65 (07/27 0802) SpO2:  [95 %-99 %] 95 % (07/27 0802) Weight:  [75 kg] 75 kg (07/27 0531)  Intake/Output from previous day: 07/26 0701 - 07/27 0700 In: 505 [P.O.:240; I.V.:265] Out: 600 [Urine:600] Intake/Output from this shift: Total I/O In: 11.5 [I.V.:11.5] Out: -   Physical Exam: Exam unchanged  Lab Results: Recent Labs    01/13/24 0301 01/14/24 0224  WBC 5.6 4.9  HGB 11.9* 12.1  PLT 190 170   Recent Labs    01/13/24 0301 01/14/24 0224  NA 133* 134*  K 3.2* 5.5*  CL 100 105  CO2 20* 19*  GLUCOSE 101* 88  BUN 17 16  CREATININE 0.71 1.02*   No results for input(s): TROPONINI in the last 72 hours.  Invalid input(s): CK, MB Hepatic Function Panel No results for input(s): PROT, ALBUMIN, AST, ALT, ALKPHOS, BILITOT, BILIDIR, IBILI in the last 72 hours. Recent Labs    01/13/24 0301  CHOL 135   No results for input(s): PROTIME in the last 72 hours.  Imaging: Imaging results have been reviewed and ECHOCARDIOGRAM COMPLETE Result Date: 01/13/2024    ECHOCARDIOGRAM REPORT   Patient Name:   Jaclyn Golden Date of Exam: 01/13/2024 Medical Rec #:  [id number]      Height:       62.0 in Accession #:    [id number]     Weight:       165.1 lb Date of Birth:  03/27/1951      BSA:          1.762 m Patient Age:    73 years       BP:           136/67 mmHg Patient Gender: F              HR:           58 bpm. Exam Location:  Inpatient Procedure: 2D Echo (Both Spectral and Color Flow Doppler were utilized during             procedure). Indications:    chest pain  History:        Patient has prior history of Echocardiogram examinations, most                 recent 10/29/2019. Risk Factors:Former Smoker, Dyslipidemia and                 Hypertension.  Sonographer:    Tinnie Barefoot RDCS Referring Phys: [id number] CAROLE N HALL IMPRESSIONS  1. Left ventricular ejection fraction, by estimation, is 65 to 70%. The left ventricle has normal function. The left ventricle has no regional wall motion abnormalities. Left ventricular diastolic parameters are indeterminate.  2. Right ventricular systolic function is normal. The right ventricular size is normal. There is normal pulmonary artery systolic pressure. The estimated right ventricular systolic pressure is 33.5 mmHg.  3. The mitral valve is degenerative. Trivial mitral valve regurgitation. No evidence of mitral stenosis. Moderate mitral annular calcification.  4. The aortic valve is tricuspid. Aortic valve regurgitation is  not visualized. Aortic valve sclerosis/calcification is present, without any evidence of aortic stenosis.  5. The inferior vena cava is normal in size with greater than 50% respiratory variability, suggesting right atrial pressure of 3 mmHg. Comparison(s): No significant change from prior study. FINDINGS  Left Ventricle: Left ventricular ejection fraction, by estimation, is 65 to 70%. The left ventricle has normal function. The left ventricle has no regional wall motion abnormalities. The left ventricular internal cavity size was normal in size. There is  no left ventricular hypertrophy. Left ventricular diastolic function could not be evaluated due to mitral annular calcification (moderate or greater). Left ventricular diastolic parameters are indeterminate. Right Ventricle: The right ventricular size is normal. No increase in right ventricular wall thickness. Right ventricular systolic function is normal. There is normal pulmonary artery systolic pressure. The tricuspid  regurgitant velocity is 2.76 m/s, and  with an assumed right atrial pressure of 3 mmHg, the estimated right ventricular systolic pressure is 33.5 mmHg. Left Atrium: Left atrial size was normal in size. Right Atrium: Right atrial size was normal in size. Pericardium: There is no evidence of pericardial effusion. Presence of epicardial fat layer. Mitral Valve: The mitral valve is degenerative in appearance. There is mild calcification of the posterior mitral valve leaflet(s). Moderate mitral annular calcification. Trivial mitral valve regurgitation. No evidence of mitral valve stenosis. Tricuspid Valve: The tricuspid valve is grossly normal. Tricuspid valve regurgitation is mild . No evidence of tricuspid stenosis. Aortic Valve: The aortic valve is tricuspid. Aortic valve regurgitation is not visualized. Aortic valve sclerosis/calcification is present, without any evidence of aortic stenosis. Pulmonic Valve: The pulmonic valve was grossly normal. Pulmonic valve regurgitation is trivial. No evidence of pulmonic stenosis. Aorta: The aortic root and ascending aorta are structurally normal, with no evidence of dilitation. Venous: The right lower pulmonary vein is normal. The inferior vena cava is normal in size with greater than 50% respiratory variability, suggesting right atrial pressure of 3 mmHg. IAS/Shunts: The atrial septum is grossly normal.  LEFT VENTRICLE PLAX 2D LVIDd:         4.00 cm   Diastology LVIDs:         2.50 cm   LV e' medial:    5.44 cm/s LV PW:         0.90 cm   LV E/e' medial:  17.2 LV IVS:        0.90 cm   LV e' lateral:   6.64 cm/s LVOT diam:     1.70 cm   LV E/e' lateral: 14.1 LV SV:         55 LV SV Index:   31 LVOT Area:     2.27 cm  RIGHT VENTRICLE             IVC RV Basal diam:  2.70 cm     IVC diam: 1.70 cm RV S prime:     15.40 cm/s TAPSE (M-mode): 2.8 cm LEFT ATRIUM             Index        RIGHT ATRIUM           Index LA diam:        4.00 cm 2.27 cm/m   RA Area:     13.40 cm LA Vol  (A2C):   62.8 ml 35.64 ml/m  RA Volume:   33.40 ml  18.95 ml/m LA Vol (A4C):   53.8 ml 30.53 ml/m LA Biplane Vol: 59.0 ml 33.48 ml/m  AORTIC  VALVE LVOT Vmax:   98.80 cm/s LVOT Vmean:  64.300 cm/s LVOT VTI:    0.242 m  AORTA Ao Root diam: 2.80 cm Ao Asc diam:  3.00 cm MITRAL VALVE                TRICUSPID VALVE MV Area (PHT): 2.44 cm     TR Peak grad:   30.5 mmHg MV Decel Time: 311 msec     TR Vmax:        276.00 cm/s MV E velocity: 93.40 cm/s MV A velocity: 108.00 cm/s  SHUNTS MV E/A ratio:  0.86         Systemic VTI:  0.24 m                             Systemic Diam: 1.70 cm Darryle Decent MD Electronically signed by Darryle Decent MD Signature Date/Time: 01/13/2024/11:02:45 AM    Final    DG Chest 2 View Result Date: 01/12/2024 CLINICAL DATA:  Chest pain EXAM: CHEST - 2 VIEW COMPARISON:  Chest x-ray 10/29/2019.  Chest CT 12/11/2023. FINDINGS: The heart size and mediastinal contours are within normal limits. Both lungs are clear. The visualized skeletal structures are unremarkable. IMPRESSION: No active cardiopulmonary disease. Electronically Signed   By: Greig Pique M.D.   On: 01/12/2024 18:13    Cardiac Studies:  Assessment/Plan:  Unstable angina MI ruled out Critical mid LAD physiologically significant stenosis by CT angio with positive FFR Hypertension Hyperlipidemia Strong family history of coronary artery disease History of tobacco abuse Plan Add clopidogrel  as per orders N.p.o. after midnight Discussed with patient regarding left cardiac cath possible PTCA stenting its risk and benefits and consents for the procedure  LOS: 1 day    Levern Hutching 01/14/2024, 11:03 AM

## 2024-01-14 NOTE — Progress Notes (Signed)
  Progress Note   Patient: Jaclyn Golden FMW:[id number] DOB: 10-30-1950 DOA: 01/12/2024     1 DOS: the patient was seen and examined on 01/14/2024   Brief hospital course: Jaclyn Golden was admitted to the hospital with the working diagnosis of acute coronary syndrome.   73 yo female with the past medical history of hypertension, hyperlipidemia, coronary artery disease, and obesity who presented with chest pain. Reported precordial chest pain, severe in intensity, constant, not improved with rest. She called EMS, she received aspirin  and was transported to the ED.  On her initial physical examination her blood pressure was 123/57, HR 77, RR 20 and 02 saturation 96% on room air.  Lungs with no rales or wheezing, heart with S1 and S2 present and regular, abdomen with no distention or tenderness and no lower extremity edema.   Na 135, K 4,3 Cl 101 bicarbonate 24 glucose 93 bun 15 cr 0,82  High sensitive troponin 4 and 5  Wbc 5,5 hgb 13,9 plt 208  Urine analysis SG 1,009, protein negative, trace leukocytes.   Chest radiograph with hyperinflation with no cardiomegaly, no effusions or infiltrates.   EKG 76 bpm, normal axis, normal intervals, qtc 427, sinus rhythm with small q wave lead I and aVL, no significant ST segment or T wave changes.   07/27 plan for cardiac catheterization tomorrow.   Assessment and Plan: * Unstable angina (HCC) Coronary CT with severe stenosis of the mid LAD.   Continue medical therapy with aspirin , metoprolol  and statin.  Anticoagulation with IV heparin .   Plan for cardiac catheterization on this hospitalization.   Essential (primary) hypertension Continue blood pressure control with metoprolol .   HLD (hyperlipidemia) Continue statin therapy   Hypokalemia Hyponatremia.   Renal function with serum cr at 1,0 with K at 5,5 and serum bicarbonate at 19  Na 134  Plan to repeat K today, possible hemolysis of sample.  Follow up renal function in am.  Avoid  hypotension   Obesity, class 1 Calculated BMI is 30.2   Tobacco dependence Smoking cessation counseling       Subjective: Patient with no chest pain or dyspnea, no PND or lower extremity edema   Physical Exam: Vitals:   01/13/24 2322 01/14/24 0531 01/14/24 0802 01/14/24 1136  BP: 126/69 129/62 111/65 (!) 158/83  Pulse: 66 (!) 58 (!) 58 62  Resp: 18 19 18 18   Temp: 98 F (36.7 C) (!) 97.5 F (36.4 C) (!) 97.5 F (36.4 C) (!) 97.5 F (36.4 C)  TempSrc: Oral Oral Oral Oral  SpO2: 99% 98% 95% 99%  Weight:  75 kg    Height:       Neurology awake and alert ENT with no pallor Cardiovascular with S1 and S2 present and regular with no gallops or rubs Respiratory with no rales or wheezing, no rhonchi  Abdomen with no distention  No lower extremity edema  Data Reviewed:    Family Communication: I spoke with patient's daughter at the bedside, we talked in detail about patient's condition, plan of care and prognosis and all questions were addressed.    Disposition: Status is: Inpatient Remains inpatient appropriate because: cardiac catheterization   Planned Discharge Destination: Home     Author: Elidia Toribio Furnace, MD 01/14/2024 4:00 PM  For on call review [email or web address].

## 2024-01-15 ENCOUNTER — Encounter (HOSPITAL_COMMUNITY): Payer: Self-pay | Admitting: Cardiology

## 2024-01-15 ENCOUNTER — Encounter (HOSPITAL_COMMUNITY)
Admission: EM | Disposition: A | Discharge status: Home / Self Care | Payer: Self-pay | Source: Home / Self Care | Attending: Internal Medicine

## 2024-01-15 DIAGNOSIS — I2510 Atherosclerotic heart disease of native coronary artery without angina pectoris: Secondary | ICD-10-CM

## 2024-01-15 DIAGNOSIS — I25110 Atherosclerotic heart disease of native coronary artery with unstable angina pectoris: Principal | ICD-10-CM

## 2024-01-15 HISTORY — DX: Atherosclerotic heart disease of native coronary artery without angina pectoris: I25.10

## 2024-01-15 HISTORY — PX: LEFT HEART CATH AND CORONARY ANGIOGRAPHY: CATH118249

## 2024-01-15 HISTORY — PX: CORONARY STENT INTERVENTION: CATH118234

## 2024-01-15 LAB — BASIC METABOLIC PANEL WITH GFR
Anion gap: 10 (ref 5–15)
BUN: 15 mg/dL (ref 8–23)
CO2: 21 mmol/L — ABNORMAL LOW (ref 22–32)
Calcium: 8.9 mg/dL (ref 8.9–10.3)
Chloride: 103 mmol/L (ref 98–111)
Creatinine, Ser: 0.82 mg/dL (ref 0.44–1.00)
GFR, Estimated: >60 mL/min (ref >60)
Glucose, Bld: 100 mg/dL — ABNORMAL HIGH (ref 70–99)
Potassium: 4.2 mmol/L (ref 3.5–5.1)
Sodium: 134 mmol/L — ABNORMAL LOW (ref 135–145)

## 2024-01-15 LAB — HEPARIN LEVEL (UNFRACTIONATED): Heparin Unfractionated: 0.4 [IU]/mL (ref 0.30–0.70)

## 2024-01-15 LAB — CBC
HCT: 36.7 % (ref 36.0–46.0)
Hemoglobin: 12.3 g/dL (ref 12.0–15.0)
MCH: 29 pg (ref 26.0–34.0)
MCHC: 33.5 g/dL (ref 30.0–36.0)
MCV: 86.6 fL (ref 80.0–100.0)
Platelets: 166 K/uL (ref 150–400)
RBC: 4.24 MIL/uL (ref 3.87–5.11)
RDW: 13.4 % (ref 11.5–15.5)
WBC: 4.8 K/uL (ref 4.0–10.5)
nRBC: 0 % (ref 0.0–0.2)

## 2024-01-15 LAB — POCT ACTIVATED CLOTTING TIME
Activated Clotting Time: 273 s
Activated Clotting Time: 325 s

## 2024-01-15 MED ORDER — LIDOCAINE HCL (PF) 1 % IJ SOLN
INTRAMUSCULAR | Status: DC | PRN
  Administered 2024-01-15: 2 mL

## 2024-01-15 MED ORDER — CLOPIDOGREL BISULFATE 75 MG PO TABS
ORAL_TABLET | ORAL | Status: DC | PRN
  Administered 2024-01-15: 300 mg via ORAL

## 2024-01-15 MED ORDER — FENTANYL CITRATE (PF) 100 MCG/2ML IJ SOLN
INTRAMUSCULAR | Status: DC | PRN
  Administered 2024-01-15: 25 ug via INTRAVENOUS

## 2024-01-15 MED ORDER — SODIUM CHLORIDE 0.9 % IV SOLN: INTRAVENOUS | Status: DC

## 2024-01-15 MED ORDER — CLOPIDOGREL BISULFATE 300 MG PO TABS
ORAL_TABLET | ORAL | Status: AC
  Filled 2024-01-15: qty 1

## 2024-01-15 MED ORDER — LIDOCAINE HCL (PF) 1 % IJ SOLN
INTRAMUSCULAR | Status: AC
  Filled 2024-01-15: qty 30

## 2024-01-15 MED ORDER — SODIUM CHLORIDE 0.9 % IV SOLN: INTRAVENOUS | Status: AC

## 2024-01-15 MED ORDER — HEPARIN SODIUM (PORCINE) 1000 UNIT/ML IJ SOLN
INTRAMUSCULAR | Status: AC
  Filled 2024-01-15: qty 10

## 2024-01-15 MED ORDER — FENTANYL CITRATE (PF) 100 MCG/2ML IJ SOLN
INTRAMUSCULAR | Status: AC
Start: 2024-01-15 — End: 2024-01-15
  Filled 2024-01-15: qty 2

## 2024-01-15 MED ORDER — IOHEXOL 350 MG/ML SOLN
INTRAVENOUS | Status: DC | PRN
  Administered 2024-01-15: 80 mL

## 2024-01-15 MED ORDER — HYDRALAZINE HCL 20 MG/ML IJ SOLN: 10.0000 mg | Freq: Once | INTRAMUSCULAR | Status: AC | PRN

## 2024-01-15 MED ORDER — MIDAZOLAM HCL 2 MG/2ML IJ SOLN
INTRAMUSCULAR | Status: AC
  Filled 2024-01-15: qty 2

## 2024-01-15 MED ORDER — VERAPAMIL HCL 2.5 MG/ML IV SOLN
INTRAVENOUS | Status: DC | PRN
  Administered 2024-01-15: 10 mL via INTRA_ARTERIAL

## 2024-01-15 MED ORDER — MIDAZOLAM HCL 2 MG/2ML IJ SOLN
INTRAMUSCULAR | Status: DC | PRN
  Administered 2024-01-15: 1 mg via INTRAVENOUS

## 2024-01-15 MED ORDER — CLOPIDOGREL BISULFATE 75 MG PO TABS
75.0000 mg | ORAL_TABLET | Freq: Every day | ORAL | Status: DC
  Administered 2024-01-16: 75 mg via ORAL
  Filled 2024-01-15: qty 1

## 2024-01-15 MED ORDER — HEPARIN (PORCINE) IN NACL 1000-0.9 UT/500ML-% IV SOLN
INTRAVENOUS | Status: DC | PRN
  Administered 2024-01-15 (×3): 500 mL

## 2024-01-15 MED ORDER — SODIUM CHLORIDE 0.9 % IV SOLN: 250.0000 mL | INTRAVENOUS | Status: DC | PRN

## 2024-01-15 MED ORDER — LABETALOL HCL 5 MG/ML IV SOLN: 10.0000 mg | INTRAVENOUS | Status: AC | PRN

## 2024-01-15 MED ORDER — VERAPAMIL HCL 2.5 MG/ML IV SOLN
INTRAVENOUS | Status: AC
  Filled 2024-01-15: qty 2

## 2024-01-15 MED ORDER — HYDRALAZINE HCL 20 MG/ML IJ SOLN
10.0000 mg | INTRAMUSCULAR | Status: AC | PRN
Start: 2024-01-15 — End: 2024-01-15

## 2024-01-15 MED ORDER — SODIUM CHLORIDE 0.9% FLUSH: 3.0000 mL | INTRAVENOUS | Status: DC | PRN

## 2024-01-15 MED ORDER — HEPARIN SODIUM (PORCINE) 1000 UNIT/ML IJ SOLN
INTRAMUSCULAR | Status: DC | PRN
  Administered 2024-01-15: 2000 [IU] via INTRAVENOUS
  Administered 2024-01-15 (×2): 4000 [IU] via INTRAVENOUS

## 2024-01-15 MED ORDER — SODIUM CHLORIDE 0.9% FLUSH
3.0000 mL | Freq: Two times a day (BID) | INTRAVENOUS | Status: DC
  Administered 2024-01-16: 3 mL via INTRAVENOUS

## 2024-01-15 NOTE — Plan of Care (Signed)
  Problem: Education: Goal: Knowledge of General Education information will improve Description: Including pain rating scale, medication(s)/side effects and non-pharmacologic comfort measures Outcome: Progressing   Problem: Clinical Measurements: Goal: Will remain free from infection Outcome: Progressing Goal: Diagnostic test results will improve Outcome: Progressing Goal: Respiratory complications will improve Outcome: Progressing Goal: Cardiovascular complication will be avoided Outcome: Progressing   Problem: Activity: Goal: Risk for activity intolerance will decrease Outcome: Progressing   Problem: Nutrition: Goal: Adequate nutrition will be maintained Outcome: Progressing   Problem: Coping: Goal: Level of anxiety will decrease Outcome: Progressing   Problem: Safety: Goal: Ability to remain free from injury will improve Outcome: Progressing   Problem: Skin Integrity: Goal: Risk for impaired skin integrity will decrease Outcome: Progressing   Problem: Activity: Goal: Ability to return to baseline activity level will improve Outcome: Progressing   Problem: Cardiovascular: Goal: Vascular access site(s) Level 0-1 will be maintained Outcome: Progressing

## 2024-01-15 NOTE — TOC CM/SW Note (Signed)
 Transition of Care [facility] - Inpatient Brief Assessment   Patient Details  Name: Jaclyn Golden MRN: [id number] Date of Birth: 10-Aug-1950  Transition [facility]) CM/SW Contact:    Luise JAYSON Pan, LCSWA Phone Number: 01/15/2024, 12:01 PM   Clinical Narrative: Patient is from home with her family. Patient reports having the following DME: bedside commode, hospital bed, RW, cane, wheelchair and a ramp, bedside commode. Patient has a PCP and uses [facility] or [facility] pharmacy. Patient reports no prior hx of HH or SNF.   TOC will continue to follow.    Transition of Care Asessment: Insurance and Status: Insurance coverage has been reviewed Patient has primary care physician: Yes Home environment has been reviewed: Home with family Prior level of function:: Independent Prior/Current Home Services: No current home services Social Drivers of Health Review: SDOH reviewed no interventions necessary Readmission risk has been reviewed: No Transition of care needs: no transition of care needs at this time

## 2024-01-15 NOTE — Plan of Care (Signed)
  Problem: Education: Goal: Knowledge of General Education information will improve Description: Including pain rating scale, medication(s)/side effects and non-pharmacologic comfort measures Outcome: Progressing   Problem: Health Behavior/Discharge Planning: Goal: Ability to manage health-related needs will improve Outcome: Progressing   Problem: Clinical Measurements: Goal: Will remain free from infection Outcome: Progressing   Problem: Activity: Goal: Risk for activity intolerance will decrease Outcome: Progressing   Problem: Elimination: Goal: Will not experience complications related to bowel motility Outcome: Progressing Goal: Will not experience complications related to urinary retention Outcome: Progressing   Problem: Pain Managment: Goal: General experience of comfort will improve and/or be controlled Outcome: Progressing   Problem: Safety: Goal: Ability to remain free from injury will improve Outcome: Progressing

## 2024-01-15 NOTE — Progress Notes (Signed)
 Subjective: Appreciate Dr. Genice help, tolerated left cardiac cath followed by PTCA stenting to mid LAD with excellent angiographic results. Patient doing well denies any chest pain or shortness of breath.  Objective:  Vital Signs in the last 24 hours: Temp:  [97.6 F (36.4 C)-97.9 F (36.6 C)] 97.7 F (36.5 C) (07/28 0802) Pulse Rate:  [52-65] 62 (07/28 1420) Resp:  [13-31] 17 (07/28 1420) BP: (113-150)/(45-97) 131/61 (07/28 1420) SpO2:  [97 %-100 %] 100 % (07/28 1420) Weight:  [75.3 kg-76.9 kg] 76.9 kg (07/28 1227)  Intake/Output from previous day: 07/27 0701 - 07/28 0700 In: 523.6 [P.O.:240; I.V.:283.6] Out: 2400 [Urine:2400] Intake/Output from this shift: Total I/O In: 43.6 [I.V.:43.6] Out: -   Physical Exam: Neck suppleno JVD or bruit Lungs clear to auscultation without rhonchi or rails Cardiovascular exam normal rate and rhythm S1-S2 normal No S3 gallop. Abdomen soft nontender Extremities no clubbing cyanosis edema Right wrist cath site no hematoma Normal capillary refills. Lab Results: Recent Labs    01/14/24 0224 01/15/24 0250  WBC 4.9 4.8  HGB 12.1 12.3  PLT 170 166   Recent Labs    01/14/24 0224 01/14/24 1830 01/15/24 0250  NA 134*  --  134*  K 5.5* 4.3 4.2  CL 105  --  103  CO2 19*  --  21*  GLUCOSE 88  --  100*  BUN 16  --  15  CREATININE 1.02*  --  0.82   No results for input(s): TROPONINI in the last 72 hours.  Invalid input(s): CK, MB Hepatic Function Panel No results for input(s): PROT, ALBUMIN, AST, ALT, ALKPHOS, BILITOT, BILIDIR, IBILI in the last 72 hours. Recent Labs    01/13/24 0301  CHOL 135   No results for input(s): PROTIME in the last 72 hours.  Imaging: Imaging results have been reviewed and CARDIAC CATHETERIZATION Result Date: 01/15/2024 Images from the original result were not included.   Culprit Lesion Segment: Mid LAD-1 lesion is 40% stenosed. Mid LAD-2 lesion is 70% stenosed with 20%  stenosed side branch in 2nd Diag.  (Significant positive by FFRCT   A drug-eluting stent was successfully placed covering both lesions crossing 1st & 2nd Diag branches, using a STENT ONYX FRONTIER 2.5X26.  Taper post dilation from 2.75 to 2.5 mm. Post intervention, there is a 0% residual stenosis.   Post intervention, there is a 0% residual stenosis. Post intervention, the2nd Diag side branch was 40% residual stenosis.   ---------------------   Otherwise normal coronary arteries.   ---------------------   LV end diastolic pressure is normal.   There is no aortic valve stenosis. Diagnostic Dominance: Co-dominant     Intervention RECOMMENDATIONS   Anticipated discharge date to be determined.   Recommend uninterrupted dual antiplatelet therapy with Aspirin  81mg  daily and Clopidogrel  75mg  daily for a minimum of 6 months (stable ischemic heart disease-Class I recommendation).   After 6 months of uninterrupted DAPT, okay to stop aspirin  and continue Plavix  to complete 1 year (and consider continued therapy for year #2 given long segment of stent in the LAD) Patient will follow-up with Dr. Levern [name], MD   Cardiac Studies:  Assessment/Plan:  Unstable angina MI ruled out Critical mid LAD physiologically significant stenosis by CT angio with positive FFR status post left cardiac cath/PTCA stenting to mid LAD Hypertension Hyperlipidemia Strong family history of coronary artery disease History of tobacco abuse Plan Continue present management Phase 1 cardiac rehab    LOS: 2 days    Levern Hutching 01/15/2024,  4:37 PM

## 2024-01-15 NOTE — Interval H&P Note (Signed)
 History and Physical Interval Note:  01/15/2024 12:33 PM  Jaclyn Golden  has presented today for surgery, with the diagnosis of unstable angina.  The various methods of treatment have been discussed with the patient and family. After consideration of risks, benefits and other options for treatment, the patient has consented to  Procedure(s): LEFT HEART CATH AND CORONARY ANGIOGRAPHY (N/A)  PERCUTANEOUS CORONARY INTERVENTION  as a surgical intervention.  The patient's history has been reviewed, patient examined, no change in status, stable for surgery.  I have reviewed the patient's chart and labs.  Questions were answered to the patient's satisfaction.     Cath Lab Visit (complete for each Cath Lab visit)  Clinical Evaluation Leading to the Procedure:   ACS: Yes.    Non-ACS:    Anginal Classification: CCS IV  Anti-ischemic medical therapy: Minimal Therapy (1 class of medications)  Non-Invasive Test Results: High-risk stress test findings: cardiac mortality >3%/year  Prior CABG: No previous CABG     Jaclyn Golden

## 2024-01-15 NOTE — Progress Notes (Signed)
 PHARMACY - ANTICOAGULATION CONSULT NOTE  Pharmacy Consult for Heparin  Indication: chest pain/ACS  Allergies  Allergen Reactions   Penicillins Other (See Comments)    Pass out   Has patient had a PCN reaction causing immediate rash, facial/tongue/throat swelling, SOB or lightheadedness with hypotension:YES Has patient had a PCN reaction causing severe rash involving mucus membranes or skin necrosis:No Has patient had a PCN reaction that required hospitalization:NO Has patient had a PCN reaction occurring within the last 10 years:NO If all of the above answers are NO, then may proceed with Cephalosporin use.     Patient Measurements: Height: 5' 2 (157.5 cm) Weight: 75.3 kg (166 lb 0.1 oz) IBW/kg (Calculated) : 50.1 HEPARIN  DW (KG): 65.6  Vital Signs: Temp: 97.7 F (36.5 C) (07/28 0802) Temp Source: Oral (07/28 0802) BP: 142/61 (07/28 0802) Pulse Rate: 57 (07/28 0802)  Labs: Recent Labs    01/12/24 1722 01/12/24 2027 01/13/24 0301 01/13/24 0740 01/13/24 1749 01/14/24 0224 01/15/24 0250  HGB 13.9  --  11.9*  --   --  12.1 12.3  HCT 41.3  --  35.8*  --   --  36.9 36.7  PLT 208  --  190  --   --  170 166  HEPARINUNFRC  --   --   --    < > 0.25* 0.35 0.40  CREATININE 0.82  --  0.71  --   --  1.02* 0.82  TROPONINIHS 4 5  --   --   --   --   --    < > = values in this interval not displayed.    Estimated Creatinine Clearance: 56.3 mL/min (by C-G formula based on SCr of 0.82 mg/dL).   Medical History: Past Medical History:  Diagnosis Date   Arthritis    right ankle, wears brace prn   Hypertension    Pain in head    occasional pain back of head on right for last couple of years, to see neurlogist for     Assessment: 81 YOF presenting with CP, hx of CAD, she is not on anticoagulation PTA, CBC wnl   Heparin  therapeutic today at 0.40 at an infusion rate of 89999 units/hr. Plans for cath today  Goal of Therapy:  Heparin  level 0.3-0.7 units/ml Monitor  platelets by anticoagulation protocol: Yes   Plan:  Continue heparin  drip 1000 units/hr Will follow plans post cath  Prentice Poisson, PharmD Clinical Pharmacist **Pharmacist phone directory can now be found on [email or web address] ([email or web address]).  Listed under [facility].

## 2024-01-15 NOTE — Progress Notes (Signed)
  Progress Note   Patient: Jaclyn Golden FMW:[id number] DOB: 04-15-1951 DOA: 01/12/2024     2 DOS: the patient was seen and examined on 01/15/2024   Brief hospital course: Mrs. Kimmer was admitted to the hospital with the working diagnosis of acute coronary syndrome.   73 yo female with the past medical history of hypertension, hyperlipidemia, coronary artery disease, and obesity who presented with chest pain. Reported precordial chest pain, severe in intensity, constant, not improved with rest. She called EMS, she received aspirin  and was transported to the ED.  On her initial physical examination her blood pressure was 123/57, HR 77, RR 20 and 02 saturation 96% on room air.  Lungs with no rales or wheezing, heart with S1 and S2 present and regular, abdomen with no distention or tenderness and no lower extremity edema.   Na 135, K 4,3 Cl 101 bicarbonate 24 glucose 93 bun 15 cr 0,82  High sensitive troponin 4 and 5  Wbc 5,5 hgb 13,9 plt 208  Urine analysis SG 1,009, protein negative, trace leukocytes.   Chest radiograph with hyperinflation with no cardiomegaly, no effusions or infiltrates.   EKG 76 bpm, normal axis, normal intervals, qtc 427, sinus rhythm with small q wave lead I and aVL, no significant ST segment or T wave changes.   07/27 plan for cardiac catheterization tomorrow.  07/28 stent placed in the LAD lesion.   Assessment and Plan: * Unstable angina (HCC) Coronary CT with severe stenosis of the mid LAD.  07/28 cardiac catheterization with culprit lesion mid LAD 40% stenosed, mid LAD 2 lesion 70% stenosed with 20% stenosed side branch in 2nd diagonal Drug eluding stent placed.   Continue medical therapy with aspirin  and clopidogrel  for 6 months, then ok to continue clopidogrel  alone for 1 to 2 years.   Continue with metoprolol  and statin.   Essential (primary) hypertension Continue blood pressure control with metoprolol .   HLD (hyperlipidemia) Continue statin therapy    Hypokalemia Hyponatremia.   Follow up renal function with serum cr at 0,82 with K at 4.2 and serum bicarbonate at 21 Na 134  Follow up renal function post catheterization   Obesity, class 1 Calculated BMI is 30.2   Tobacco dependence Smoking cessation counseling       Subjective: Patient with no chest pain or dyspnea, had cardiac catheterization today   Physical Exam: Vitals:   01/15/24 1420 01/15/24 1630 01/15/24 1645 01/15/24 1702  BP: 131/61   (!) 156/91  Pulse: 62 (!) 56 (!) 54 66  Resp: 17 15 12 12   Temp:    97.9 F (36.6 C)  TempSrc:    Oral  SpO2: 100% 96% 98% 99%  Weight:      Height:       Neurology awake and alert  ENT with no pallor Cardiovascular with S1 and S2 present and regular with no gallops, rubs or murmurs  Respiratory with no rales or wheezing, no rhonchi  Abdomen with no distention  No lower extremity edema   Data Reviewed:    Family Communication: no family at the bedside   Disposition: Status is: Inpatient Remains inpatient appropriate because: post cardiac cath   Planned Discharge Destination: Home     Author: Elidia Toribio Furnace, MD 01/15/2024 5:23 PM  For on call review [email or web address].

## 2024-01-16 ENCOUNTER — Other Ambulatory Visit (HOSPITAL_COMMUNITY): Payer: Self-pay

## 2024-01-16 DIAGNOSIS — E785 Hyperlipidemia, unspecified: Secondary | ICD-10-CM | POA: Diagnosis not present

## 2024-01-16 DIAGNOSIS — E876 Hypokalemia: Secondary | ICD-10-CM | POA: Diagnosis not present

## 2024-01-16 DIAGNOSIS — I10 Essential (primary) hypertension: Secondary | ICD-10-CM | POA: Diagnosis not present

## 2024-01-16 DIAGNOSIS — I200 Unstable angina: Secondary | ICD-10-CM | POA: Diagnosis not present

## 2024-01-16 LAB — CBC
HCT: 35.8 % — ABNORMAL LOW (ref 36.0–46.0)
Hemoglobin: 12.1 g/dL (ref 12.0–15.0)
MCH: 29.1 pg (ref 26.0–34.0)
MCHC: 33.8 g/dL (ref 30.0–36.0)
MCV: 86.1 fL (ref 80.0–100.0)
Platelets: 163 K/uL (ref 150–400)
RBC: 4.16 MIL/uL (ref 3.87–5.11)
RDW: 13.5 % (ref 11.5–15.5)
WBC: 6.2 K/uL (ref 4.0–10.5)
nRBC: 0 % (ref 0.0–0.2)

## 2024-01-16 LAB — BASIC METABOLIC PANEL WITH GFR
Anion gap: 10 (ref 5–15)
BUN: 12 mg/dL (ref 8–23)
CO2: 22 mmol/L (ref 22–32)
Calcium: 8.9 mg/dL (ref 8.9–10.3)
Chloride: 102 mmol/L (ref 98–111)
Creatinine, Ser: 0.97 mg/dL (ref 0.44–1.00)
GFR, Estimated: >60 mL/min (ref >60)
Glucose, Bld: 89 mg/dL (ref 70–99)
Potassium: 3.7 mmol/L (ref 3.5–5.1)
Sodium: 134 mmol/L — ABNORMAL LOW (ref 135–145)

## 2024-01-16 MED ORDER — METOPROLOL SUCCINATE ER 25 MG PO TB24: 25.0000 mg | ORAL_TABLET | Freq: Every day | ORAL | Status: DC

## 2024-01-16 MED ORDER — CLOPIDOGREL BISULFATE 75 MG PO TABS
75.0000 mg | ORAL_TABLET | Freq: Every day | ORAL | 0 refills | Status: DC
  Filled 2024-01-16: qty 30, 30d supply, fill #0

## 2024-01-16 MED ORDER — METOPROLOL SUCCINATE ER 25 MG PO TB24
25.0000 mg | ORAL_TABLET | Freq: Every day | ORAL | 0 refills | Status: DC
Start: 2024-01-16 — End: 2024-02-02
  Filled 2024-01-16: qty 30, 30d supply, fill #0

## 2024-01-16 MED ORDER — ROSUVASTATIN CALCIUM 40 MG PO TABS
40.0000 mg | ORAL_TABLET | Freq: Every day | ORAL | 0 refills | Status: DC
  Filled 2024-01-16: qty 30, 30d supply, fill #0

## 2024-01-16 MED ORDER — ROSUVASTATIN CALCIUM 5 MG PO TABS
5.0000 mg | ORAL_TABLET | Freq: Every day | ORAL | Status: AC
  Administered 2024-01-16: 5 mg via ORAL
  Filled 2024-01-16: qty 1

## 2024-01-16 MED ORDER — ASPIRIN 81 MG PO TBEC
81.0000 mg | DELAYED_RELEASE_TABLET | Freq: Every day | ORAL | 12 refills | Status: DC
Start: 2024-01-16 — End: 2024-02-12
  Filled 2024-01-16: qty 30, 30d supply, fill #0

## 2024-01-16 MED ORDER — ROSUVASTATIN CALCIUM 20 MG PO TABS
40.0000 mg | ORAL_TABLET | Freq: Every day | ORAL | Status: DC
  Filled 2024-01-16: qty 2

## 2024-01-16 MED ORDER — METOPROLOL SUCCINATE ER 25 MG PO TB24
25.0000 mg | ORAL_TABLET | Freq: Every day | ORAL | 0 refills | Status: DC
Start: 2024-01-17 — End: 2024-01-16
  Filled 2024-01-16: qty 30, 30d supply, fill #0

## 2024-01-16 MED FILL — Clopidogrel Bisulfate Tab 300 MG (Base Equiv): ORAL | Qty: 1 | Status: AC

## 2024-01-16 NOTE — Progress Notes (Signed)
 Subjective:  Doing well denies any chest pain or shortness of breath denies any pain at the cath site ready to go home.  Patient initially reluctant to take higher dose of Crestor  but agreed to take unless he has muscle issues will cut down the dose as outpatient  Objective:  Vital Signs in the last 24 hours: Temp:  [97.8 F (36.6 C)-98.3 F (36.8 C)] 98.2 F (36.8 C) (07/29 0848) Pulse Rate:  [53-66] 65 (07/29 0848) Resp:  [12-31] 15 (07/29 0848) BP: (113-156)/(45-96) 142/66 (07/29 0848) SpO2:  [94 %-100 %] 96 % (07/29 0848) Weight:  [74.9 kg-76.9 kg] 74.9 kg (07/29 0356)  Intake/Output from previous day: 07/28 0701 - 07/29 0700 In: 398.6 [P.O.:240; I.V.:158.6] Out: -  Intake/Output from this shift: No intake/output data recorded.  Physical Exam: Exam unchanged  Lab Results: Recent Labs    01/15/24 0250 01/16/24 0427  WBC 4.8 6.2  HGB 12.3 12.1  PLT 166 163   Recent Labs    01/15/24 0250 01/16/24 0427  NA 134* 134*  K 4.2 3.7  CL 103 102  CO2 21* 22  GLUCOSE 100* 89  BUN 15 12  CREATININE 0.82 0.97   No results for input(s): TROPONINI in the last 72 hours.  Invalid input(s): CK, MB Hepatic Function Panel No results for input(s): PROT, ALBUMIN, AST, ALT, ALKPHOS, BILITOT, BILIDIR, IBILI in the last 72 hours. No results for input(s): CHOL in the last 72 hours. No results for input(s): PROTIME in the last 72 hours.  Imaging: Imaging results have been reviewed and CARDIAC CATHETERIZATION Result Date: 01/15/2024 Images from the original result were not included.   Culprit Lesion Segment: Mid LAD-1 lesion is 40% stenosed. Mid LAD-2 lesion is 70% stenosed with 20% stenosed side branch in 2nd Diag.  (Significant positive by FFRCT   A drug-eluting stent was successfully placed covering both lesions crossing 1st & 2nd Diag branches, using a STENT ONYX FRONTIER 2.5X26.  Taper post dilation from 2.75 to 2.5 mm. Post intervention, there is a 0%  residual stenosis.   Post intervention, there is a 0% residual stenosis. Post intervention, the2nd Diag side branch was 40% residual stenosis.   ---------------------   Otherwise normal coronary arteries.   ---------------------   LV end diastolic pressure is normal.   There is no aortic valve stenosis. Diagnostic Dominance: Co-dominant     Intervention RECOMMENDATIONS   Anticipated discharge date to be determined.   Recommend uninterrupted dual antiplatelet therapy with Aspirin  81mg  daily and Clopidogrel  75mg  daily for a minimum of 6 months (stable ischemic heart disease-Class I recommendation).   After 6 months of uninterrupted DAPT, okay to stop aspirin  and continue Plavix  to complete 1 year (and consider continued therapy for year #2 given long segment of stent in the LAD) Patient will follow-up with Dr. Levern [name], MD   Cardiac Studies:  Assessment/Plan:  Unstable angina MI ruled out Critical mid LAD physiologically significant stenosis by CT angio with positive FFR status post left cardiac cath/PTCA stenting to mid LAD Hypertension Hyperlipidemia Strong family history of coronary artery disease History of tobacco abuse Plan Okay to discharge from cardiac point of view Follow-up with me 1 to 2-week Scheduled for phase 2 cardiac rehab as outpatient  LOS: 3 days    Jaclyn Golden 01/16/2024, 12:07 PM

## 2024-01-16 NOTE — Progress Notes (Signed)
 Patient refused Crestor  40mg . Patient states she only agrees to take 10mg . She explains that she will not take 40mg  because she is afraid her muscles will seize up like her daughters did. Attending and cardiologist made aware.

## 2024-01-16 NOTE — Progress Notes (Signed)
 Patient's right radial site assessed. No bleeding note. Mild, soft hematoma noted above cath insertion site. Site cleansed, and gauze with Tegaderm dressing applied. Patient education provided related to right radial cath site - do not apply pressure or lift object with right wrist. Patient instructed to notify staff immediate if any signs of bleeding are noticed.

## 2024-01-16 NOTE — Progress Notes (Signed)
 Dr. Duffy at bedside to look at radial cath site. Patient TR Band still in place and has had problems with bleeding from site when attempting to remove air from band . Cath site remains level 1.

## 2024-01-16 NOTE — Progress Notes (Signed)
 Scant bleeding noted on right radial dressing.   Dressing removed, no signs of bleeding, no changes note. New dressing applied (gauze and medipore tape).

## 2024-01-16 NOTE — Progress Notes (Signed)
 Jaclyn Golden is s/p LAC PCI earlier today. Her home rosuvastatin  was increased form 5 mg to 40 mg after PCI this evening. She declined the 40 mg dose tonight, but preferred to take her home 5 mg dose. Importance of increased dose was discussed, but Ms. Burruel preferred her lower dose for tonight. We ordered 5 mg dose tonight.  Merlene Blood, MD MS  Cardiology [facility]

## 2024-01-16 NOTE — Progress Notes (Signed)
 Med detail completed and given to [place].  Waiting for consult from cardiology prior to AVS and discharge teaching. Per RN, patient refused Crestor  40mg  this am and needs to discuss with Cardiology prior to discharge.  Tele unit called for tele removal.  PIV removed by RN.  Cardiac Stent card given to patient.  Patient expressed verbal understanding that card must be kept with her at all times.  RIDE at bedside. Needs TOC pharmacy.  All information updated with Primary RN.

## 2024-01-16 NOTE — Progress Notes (Signed)
 Discussed with pt stent, restrictions, Plavix  importance, diet, exercise, NTG and CRPII. Pt receptive. Will refer to [facility] CRPII however pt does not seem interested.  [id number] Jaclyn Golden BS, ACSM-CEP 01/16/2024 10:05 AM

## 2024-01-16 NOTE — TOC Initial Note (Signed)
 Transition of Care [facility]) - Initial/Assessment Note    Patient Details  Name: Jaclyn Golden MRN: [id number] Date of Birth: 04/14/1951  [facility]) CM/SW Contact:    Sudie Erminio Deems, RN Phone Number: 01/16/2024, 11:41 AM  Clinical Narrative:  Patient presented for unstable angina. Post LHC plan for home on Plavix . Patient states she lives with daughter and daughter was in the room at the time of the visit. No home health needs identified during the visit. Family to provide transportation home. No further needs identified.               Expected Discharge Plan: Home/Self Care Barriers to Discharge: No Barriers Identified   Patient Goals and CMS Choice Patient states their goals for this hospitalization and ongoing recovery are:: to return home with family suport   Choice offered to / list presented to : NA      Expected Discharge Plan and Services In-house Referral: NA Discharge Planning Services: CM Consult Post Acute Care Choice: NA Living arrangements for the past 2 months: Single Family Home Expected Discharge Date: 01/16/24                 DME Agency: NA       HH Arranged: NA          Prior Living Arrangements/Services Living arrangements for the past 2 months: Single Family Home Lives with:: Adult Children Patient language and need for interpreter reviewed:: Yes Do you feel safe going back to the place where you live?: Yes      Need for Family Participation in Patient Care: No (Comment) Care giver support system in place?: No (comment)   Criminal Activity/Legal Involvement Pertinent to Current Situation/Hospitalization: No - Comment as needed  Activities of Daily Living   ADL Screening (condition at time of admission) Independently performs ADLs?: Yes (appropriate for developmental age) Is the patient deaf or have difficulty hearing?: No Does the patient have difficulty seeing, even when wearing glasses/contacts?: No Does the  patient have difficulty concentrating, remembering, or making decisions?: No  Permission Sought/Granted Permission sought to share information with : Family Supports, Case Manager                Emotional Assessment Appearance:: Appears stated age Attitude/Demeanor/Rapport: Engaged Affect (typically observed): Appropriate Orientation: : Oriented to Self, Oriented to Place, Oriented to  Time, Oriented to Situation Alcohol / Substance Use: Not Applicable Psych Involvement: No (comment)  Admission diagnosis:  Unstable angina (HCC) [I20.0] Hypertensive urgency [I16.0] Chest pain [R07.9] NSTEMI (non-ST elevated myocardial infarction) [facility]) [I21.4] Patient Active Problem List   Diagnosis Date Noted   NSTEMI (non-ST elevated myocardial infarction) (HCC) 01/13/2024   Obesity, class 1 01/13/2024   Hypokalemia 01/13/2024   Unstable angina (HCC) 01/12/2024   Syncope 10/29/2019   Tobacco dependence 10/29/2019   Essential (primary) hypertension 07/09/2018   HLD (hyperlipidemia) 07/09/2018   Status post cataract extraction and insertion of intraocular lens of right eye 07/09/2018   Hyperglycemia 11/24/1967   PCP:  Seabron Lenis, MD Pharmacy:   [facility] [phone] [address], [place] - [address] AT [address] [place] [address] Phone: [phone] Fax: [phone]  [facility] [address] [place] [address] Phone: [phone] Fax: [phone]     Social Drivers of Health (SDOH) Social History: SDOH Screenings   Food Insecurity: No Food Insecurity (01/13/2024)  Housing: Low Risk  (01/13/2024)  Transportation Needs: No Transportation Needs (01/13/2024)  Utilities: Not At Risk (01/13/2024)  Social Connections: Socially Isolated (01/13/2024)  Tobacco Use: Medium Risk (01/12/2024)   SDOH Interventions:     Readmission Risk Interventions     No data to display

## 2024-01-16 NOTE — Discharge Summary (Signed)
 Physician Discharge Summary   Patient: Jaclyn Golden MRN: [id number] DOB: 08-14-1950  Admit date:     01/12/2024  Discharge date: 01/16/24  Discharge Physician: Elidia Sieving Bonniejean Piano   PCP: Seabron Lenis, MD   Recommendations at discharge:    Patient has been placed on dual antiplatelet therapy with aspirin  and clopidogrel  to take for 6 months, then continue clopidogrel  alone to complete one year, (and consider continued therapy for #2 given long segment of stent in the LAD).  Rosuvastatin  increased to 40 mg daily  Discontinued hydrochlorothiazide and placed on metoprolol  succinate 25 mg po daily.   Follow up with DR Seabron in 7 to 10 days Follow up with Cardiology as scheduled.   Discharge Diagnoses: Principal Problem:   Unstable angina (HCC) Active Problems:   Essential (primary) hypertension   HLD (hyperlipidemia)   Hypokalemia   Obesity, class 1   Tobacco dependence  Resolved Problems:   * No resolved hospital problems. [facility] Course: Jaclyn Golden was admitted to the hospital with the working diagnosis of acute coronary syndrome.   73 yo female with the past medical history of hypertension, hyperlipidemia, coronary artery disease, and obesity who presented with chest pain. Reported precordial chest pain, severe in intensity, constant, not improved with rest. She called EMS, she received aspirin  and was transported to the ED.  On her initial physical examination her blood pressure was 123/57, HR 77, RR 20 and 02 saturation 96% on room air.  Lungs with no rales or wheezing, heart with S1 and S2 present and regular, abdomen with no distention or tenderness and no lower extremity edema.   Na 135, K 4,3 Cl 101 bicarbonate 24 glucose 93 bun 15 cr 0,82  High sensitive troponin 4 and 5  Wbc 5,5 hgb 13,9 plt 208  Urine analysis SG 1,009, protein negative, trace leukocytes.   Chest radiograph with hyperinflation with no cardiomegaly, no effusions or infiltrates.   EKG 76  bpm, normal axis, normal intervals, qtc 427, sinus rhythm with small q wave lead I and aVL, no significant ST segment or T wave changes.   07/27 plan for cardiac catheterization tomorrow.  07/28 stent placed in the LAD lesion.  07/29 no further chest pain, patient will go home and will follow up as outpatient.   Assessment and Plan: * Unstable angina (HCC) Coronary CT with severe stenosis of the mid LAD.  07/28 cardiac catheterization with culprit lesion mid LAD 40% stenosed, mid LAD 2 lesion 70% stenosed with 20% stenosed side branch in 2nd diagonal Drug eluding stent placed.   Continue medical therapy with aspirin  and clopidogrel  for 6 months, then ok to continue clopidogrel  alone for 1 to 2 years.   Continue with metoprolol  and statin.   Essential (primary) hypertension Continue blood pressure control with metoprolol .   HLD (hyperlipidemia) Continue statin therapy, rosuvastatin  increased to 40 mg.  Further dose adjustments as outpatient.   Hypokalemia Hyponatremia.   Discharge serum cr is 0,97 with K at 3,7 and serum bicarbonate at 22  Na 134 . Follow up renal function and electrolytes as outpatient.   Obesity, class 1 Calculated BMI is 30.2   Tobacco dependence Smoking cessation counseling        Consultants: cardiology  Procedures performed: cardiac catheterization with PCI   Disposition: Home Diet recommendation:  Cardiac diet DISCHARGE MEDICATION: Allergies as of 01/16/2024       Reactions   Penicillins Other (See Comments)   Pass out  Has patient had a  PCN reaction causing immediate rash, facial/tongue/throat swelling, SOB or lightheadedness with hypotension:YES Has patient had a PCN reaction causing severe rash involving mucus membranes or skin necrosis:No Has patient had a PCN reaction that required hospitalization:NO Has patient had a PCN reaction occurring within the last 10 years:NO If all of the above answers are NO, then may proceed with  Cephalosporin use.        Medication List     STOP taking these medications    hydrochlorothiazide 12.5 MG capsule Commonly known as: MICROZIDE   Macrobid 100 MG capsule Generic drug: nitrofurantoin (macrocrystal-monohydrate)       TAKE these medications    aspirin  EC 81 MG tablet Take 1 tablet (81 mg total) by mouth daily. Swallow whole.   clopidogrel  75 MG tablet Commonly known as: PLAVIX  Take 1 tablet (75 mg total) by mouth daily with breakfast. Start taking on: January 17, 2024   Coenzyme Q10 100 MG capsule Take 100 mg by mouth daily.   meclizine  25 MG tablet Commonly known as: ANTIVERT  Take 1 tablet (25 mg total) by mouth 3 (three) times daily as needed for dizziness.   metoprolol  succinate 25 MG 24 hr tablet Commonly known as: TOPROL -XL Take 1 tablet (25 mg total) by mouth daily.   PRESERVISION AREDS 2 PO Take 1 capsule by mouth in the morning and at bedtime.   rosuvastatin  40 MG tablet Commonly known as: CRESTOR  Take 1 tablet (40 mg total) by mouth daily. What changed:  medication strength how much to take when to take this   telmisartan 20 MG tablet Commonly known as: MICARDIS Take 20 mg by mouth daily.   VITAMIN K2-VITAMIN D3 PO Take 1 capsule by mouth daily at 12 noon.        Discharge Exam: Filed Weights   01/15/24 0516 01/15/24 1227 01/16/24 0356  Weight: 75.3 kg 76.9 kg 74.9 kg   Neurology awake and alert ENT with no pallor Cardiovascular with S1 and S2 present and regular with no gallops, rubs or murmurs Respiratory with no rales or wheezing, no rhonchi  Abdomen with no distention  No lower extremity edema   Condition at discharge: stable  The results of significant diagnostics from this hospitalization (including imaging, microbiology, ancillary and laboratory) are listed below for reference.   Imaging Studies: CARDIAC CATHETERIZATION Result Date: 01/15/2024 Images from the original result were not included.   Culprit Lesion  Segment: Mid LAD-1 lesion is 40% stenosed. Mid LAD-2 lesion is 70% stenosed with 20% stenosed side branch in 2nd Diag.  (Significant positive by FFRCT   A drug-eluting stent was successfully placed covering both lesions crossing 1st & 2nd Diag branches, using a STENT ONYX FRONTIER 2.5X26.  Taper post dilation from 2.75 to 2.5 mm. Post intervention, there is a 0% residual stenosis.   Post intervention, there is a 0% residual stenosis. Post intervention, the2nd Diag side branch was 40% residual stenosis.   ---------------------   Otherwise normal coronary arteries.   ---------------------   LV end diastolic pressure is normal.   There is no aortic valve stenosis. Diagnostic Dominance: Co-dominant     Intervention RECOMMENDATIONS   Anticipated discharge date to be determined.   Recommend uninterrupted dual antiplatelet therapy with Aspirin  81mg  daily and Clopidogrel  75mg  daily for a minimum of 6 months (stable ischemic heart disease-Class I recommendation).   After 6 months of uninterrupted DAPT, okay to stop aspirin  and continue Plavix  to complete 1 year (and consider continued therapy for year #2 given long  segment of stent in the LAD) Patient will follow-up with Dr. Levern [name], MD  ECHOCARDIOGRAM COMPLETE Result Date: 01/13/2024    ECHOCARDIOGRAM REPORT   Patient Name:   Jaclyn Golden Date of Exam: 01/13/2024 Medical Rec #:  [id number]      Height:       62.0 in Accession #:    [id number]     Weight:       165.1 lb Date of Birth:  12/13/1950      BSA:          1.762 m Patient Age:    73 years       BP:           136/67 mmHg Patient Gender: F              HR:           58 bpm. Exam Location:  Inpatient Procedure: 2D Echo (Both Spectral and Color Flow Doppler were utilized during            procedure). Indications:    chest pain  History:        Patient has prior history of Echocardiogram examinations, most                 recent 10/29/2019. Risk Factors:Former Smoker, Dyslipidemia and                  Hypertension.  Sonographer:    Tinnie Barefoot RDCS Referring Phys: [id number] CAROLE N HALL IMPRESSIONS  1. Left ventricular ejection fraction, by estimation, is 65 to 70%. The left ventricle has normal function. The left ventricle has no regional wall motion abnormalities. Left ventricular diastolic parameters are indeterminate.  2. Right ventricular systolic function is normal. The right ventricular size is normal. There is normal pulmonary artery systolic pressure. The estimated right ventricular systolic pressure is 33.5 mmHg.  3. The mitral valve is degenerative. Trivial mitral valve regurgitation. No evidence of mitral stenosis. Moderate mitral annular calcification.  4. The aortic valve is tricuspid. Aortic valve regurgitation is not visualized. Aortic valve sclerosis/calcification is present, without any evidence of aortic stenosis.  5. The inferior vena cava is normal in size with greater than 50% respiratory variability, suggesting right atrial pressure of 3 mmHg. Comparison(s): No significant change from prior study. FINDINGS  Left Ventricle: Left ventricular ejection fraction, by estimation, is 65 to 70%. The left ventricle has normal function. The left ventricle has no regional wall motion abnormalities. The left ventricular internal cavity size was normal in size. There is  no left ventricular hypertrophy. Left ventricular diastolic function could not be evaluated due to mitral annular calcification (moderate or greater). Left ventricular diastolic parameters are indeterminate. Right Ventricle: The right ventricular size is normal. No increase in right ventricular wall thickness. Right ventricular systolic function is normal. There is normal pulmonary artery systolic pressure. The tricuspid regurgitant velocity is 2.76 m/s, and  with an assumed right atrial pressure of 3 mmHg, the estimated right ventricular systolic pressure is 33.5 mmHg. Left Atrium: Left atrial size was normal in size. Right Atrium:  Right atrial size was normal in size. Pericardium: There is no evidence of pericardial effusion. Presence of epicardial fat layer. Mitral Valve: The mitral valve is degenerative in appearance. There is mild calcification of the posterior mitral valve leaflet(s). Moderate mitral annular calcification. Trivial mitral valve regurgitation. No evidence of mitral valve stenosis. Tricuspid Valve: The tricuspid valve is grossly normal. Tricuspid valve regurgitation is mild .  No evidence of tricuspid stenosis. Aortic Valve: The aortic valve is tricuspid. Aortic valve regurgitation is not visualized. Aortic valve sclerosis/calcification is present, without any evidence of aortic stenosis. Pulmonic Valve: The pulmonic valve was grossly normal. Pulmonic valve regurgitation is trivial. No evidence of pulmonic stenosis. Aorta: The aortic root and ascending aorta are structurally normal, with no evidence of dilitation. Venous: The right lower pulmonary vein is normal. The inferior vena cava is normal in size with greater than 50% respiratory variability, suggesting right atrial pressure of 3 mmHg. IAS/Shunts: The atrial septum is grossly normal.  LEFT VENTRICLE PLAX 2D LVIDd:         4.00 cm   Diastology LVIDs:         2.50 cm   LV e' medial:    5.44 cm/s LV PW:         0.90 cm   LV E/e' medial:  17.2 LV IVS:        0.90 cm   LV e' lateral:   6.64 cm/s LVOT diam:     1.70 cm   LV E/e' lateral: 14.1 LV SV:         55 LV SV Index:   31 LVOT Area:     2.27 cm  RIGHT VENTRICLE             IVC RV Basal diam:  2.70 cm     IVC diam: 1.70 cm RV S prime:     15.40 cm/s TAPSE (M-mode): 2.8 cm LEFT ATRIUM             Index        RIGHT ATRIUM           Index LA diam:        4.00 cm 2.27 cm/m   RA Area:     13.40 cm LA Vol (A2C):   62.8 ml 35.64 ml/m  RA Volume:   33.40 ml  18.95 ml/m LA Vol (A4C):   53.8 ml 30.53 ml/m LA Biplane Vol: 59.0 ml 33.48 ml/m  AORTIC VALVE LVOT Vmax:   98.80 cm/s LVOT Vmean:  64.300 cm/s LVOT VTI:    0.242 m   AORTA Ao Root diam: 2.80 cm Ao Asc diam:  3.00 cm MITRAL VALVE                TRICUSPID VALVE MV Area (PHT): 2.44 cm     TR Peak grad:   30.5 mmHg MV Decel Time: 311 msec     TR Vmax:        276.00 cm/s MV E velocity: 93.40 cm/s MV A velocity: 108.00 cm/s  SHUNTS MV E/A ratio:  0.86         Systemic VTI:  0.24 m                             Systemic Diam: 1.70 cm Darryle Decent MD Electronically signed by Darryle Decent MD Signature Date/Time: 01/13/2024/11:02:45 AM    Final    DG Chest 2 View Result Date: 01/12/2024 CLINICAL DATA:  Chest pain EXAM: CHEST - 2 VIEW COMPARISON:  Chest x-ray 10/29/2019.  Chest CT 12/11/2023. FINDINGS: The heart size and mediastinal contours are within normal limits. Both lungs are clear. The visualized skeletal structures are unremarkable. IMPRESSION: No active cardiopulmonary disease. Electronically Signed   By: Greig Pique M.D.   On: 01/12/2024 18:13   CT CORONARY FRACTIONAL FLOW RESERVE FLUID ANALYSIS Result  Date: 01/11/2024 EXAM: FFRCT ANALYSIS for abnormal coronary CT-A. FINDINGS: FFRct analysis was performed on the original cardiac CT angiogram dataset. Diagrammatic representation of the FFRct analysis is provided in a separate PDF document in PACS. This dictation was created using the PDF document and an interactive 3D model of the results. 3D model is not available in the EMR/PACS. Normal FFR range is >0.80. 1. Left Main: FFRct 0.95 2. LAD: FFRct 0.92 proximal, 0.7 mid. 3. LCX: FFRct 0.95 proximal, 0.91 mid., 0.87 distal.  OM1 FFRct 0.89 4. RCA: FFRct 0.98 proximal, 0.95 mid, 0.94 distal. IMPRESSION: 1. FFRct findings are consistent with severe stenosis in the mid LAD. 2.  Recommend cardiac catheterization. Tiffany C. Raford, MD Electronically Signed   By: Annabella Raford M.D.   On: 01/11/2024 13:51   CT CORONARY MORPH W/CTA COR W/SCORE W/CA W/CM &/OR WO/CM Addendum Date: 01/11/2024 ADDENDUM REPORT: 01/11/2024 13:48 ADDENDUM: The following report is an over-read  performed by radiologist Dr. Reyes Holder of [facility] on January 11, 2024. This over-read does not include interpretation of cardiac or coronary anatomy or pathology. The coronary calcium  score/coronary CTA interpretation by the cardiologist is attached. COMPARISON:  None. FINDINGS: Vascular: Aortic atherosclerosis. Mediastinum/Nodes: Tiny hiatal hernia. Lungs/Pleura: Mata seal in the left lower lobe. Scattered tiny calcified pulmonary nodules Upper Abdomen: Visualized portions of the upper abdomen are unremarkable. Musculoskeletal: There are no aggressive appearing lytic or blastic lesions noted in the visualized portions of the skeleton. IMPRESSION: 1. Tiny hiatal hernia. 2. Aortic atherosclerosis. Aortic Atherosclerosis (ICD10-I70.0). Electronically Signed   By: Reyes Holder M.D.   On: 01/11/2024 13:48   Result Date: 01/11/2024 CLINICAL DATA:  72F with hypertension, hyperlipidemia, fatigue and family history of CAD. EXAM: Cardiac/Coronary  CT TECHNIQUE: The patient was scanned on a GE Apex scanner. PROTOCOL: A non-contrast, gated CT scan was obtained with axial slices of 2.5 mm through the heart for calcium  scoring. Calcium  scoring was performed using the Agatston method. A 120 kV prospective, gated, contrast cardiac CT scan was obtained. Gantry rotation speed was 230 msec and collimation was 0.63 mm. Two sublingual nitroglycerin  tablets (0.8 mg) were given. The 3D data set was reconstructed with motion correction for the best systolic or diastolic phase. Images were analyzed on a dedicated workstation using MPR, MIP, and VRT modes. The patient received 95 cc of contrast. FINDINGS: Aorta: Normal size.  Aortic atherosclerosis.  No dissection. Aortic Valve:  Trileaflet.  No calcifications. Coronary Arteries:  Normal coronary origin.  Right dominance. RCA is a large dominant artery that gives rise to PDA and PLVB. There is minimal (<25%) mixed plaque in the mid vessel. Left main is a large artery  that gives rise to LAD and LCX arteries. There is minimal (<25%) calcified plaque. LAD is a large vessel that has severe (>70%) mixed plaque in the mid vessel. There is minimal (<25%) soft plaque in the mid LAD. LCX is a non-dominant artery with minimal (<25%) soft plaque in the mid vessel. OM1 there is mild (25-49%) soft plaque. There is a small OM2 and OM3 without significant disease. Coronary Calcium  Score: Left main: 51 Left anterior descending artery: 159 Left circumflex artery: 170 Right coronary artery: 70.2 Total: 450 Percentile: 84th Other findings: Normal pulmonary vein drainage into the left atrium. Normal let atrial appendage without a thrombus. Normal size of the pulmonary artery. Non-cardiac: See separate report from [facility]. IMPRESSION: 1. Coronary calcium  score of 450. This was 84th percentile for age-, sex, and race-matched controls. 2.  Total plaque volume 582 mm3 which is 78th percentile for age- and sex-matched controls (calcified plaque 85 mm3; non-calcified plaque 497 mm3). TPV is severe. 3. Normal coronary origin with right dominance. 4. There is severe (>70%) stenosis in the mid LAD. There is mild (25-49%) stenosis in OM1. CAD-RADS 4. 5.  Aortic atherosclerosis. 6.  Will send study for FFR. RECOMMENDATIONS: CAD-RADS 4: Severe stenosis. (70-99% or > 50% left main). Cardiac catheterization or CT FFR is recommended. Consider symptom-guided anti-ischemic pharmacotherapy as well as risk factor modification per guideline directed care. Invasive coronary angiography recommended with revascularization per published guideline statements. Annabella Scarce, MD Electronically Signed: By: Annabella Scarce M.D. On: 01/11/2024 13:43    Microbiology: Results for orders placed or performed during the hospital encounter of 10/01/20  SARS CORONAVIRUS 2 (TAT 6-24 HRS) Nasopharyngeal Nasopharyngeal Swab     Status: None   Collection Time: 10/01/20  2:08 PM   Specimen: Nasopharyngeal Swab   Result Value Ref Range Status   SARS Coronavirus 2 NEGATIVE NEGATIVE Final    Comment: (NOTE) SARS-CoV-2 target nucleic acids are NOT DETECTED.  The SARS-CoV-2 RNA is generally detectable in upper and lower respiratory specimens during the acute phase of infection. Negative results do not preclude SARS-CoV-2 infection, do not rule out co-infections with other pathogens, and should not be used as the sole basis for treatment or other patient management decisions. Negative results must be combined with clinical observations, patient history, and epidemiological information. The expected result is Negative.  Fact Sheet for Patients: HairSlick.no  Fact Sheet for Healthcare Providers: quierodirigir.com  This test is not yet approved or cleared by the [place]  FDA and  has been authorized for detection and/or diagnosis of SARS-CoV-2 by FDA under an Emergency Use Authorization (EUA). This EUA will remain  in effect (meaning this test can be used) for the duration of the COVID-19 declaration under Se ction 564(b)(1) of the Act, 21 U.S.C. section 360bbb-3(b)(1), unless the authorization is terminated or revoked sooner.  Performed at [facility], [address] [place], [place] [address]     Labs: CBC: Recent Labs  Lab 01/12/24 1722 01/13/24 0301 01/14/24 0224 01/15/24 0250 01/16/24 0427  WBC 5.5 5.6 4.9 4.8 6.2  HGB 13.9 11.9* 12.1 12.3 12.1  HCT 41.3 35.8* 36.9 36.7 35.8*  MCV 86.4 88.2 88.1 86.6 86.1  PLT 208 190 170 166 163   Basic Metabolic Panel: Recent Labs  Lab 01/12/24 1722 01/13/24 0301 01/14/24 0224 01/14/24 1830 01/15/24 0250 01/16/24 0427  NA 135 133* 134*  --  134* 134*  K 4.3 3.2* 5.5* 4.3 4.2 3.7  CL 101 100 105  --  103 102  CO2 24 20* 19*  --  21* 22  GLUCOSE 93 101* 88  --  100* 89  BUN 15 17 16   --  15 12  CREATININE 0.82 0.71 1.02*  --  0.82 0.97  CALCIUM  9.2 9.0 9.1  --  8.9  8.9  MG  --  1.7  --   --   --   --   PHOS  --  3.6  --   --   --   --    Liver Function Tests: No results for input(s): AST, ALT, ALKPHOS, BILITOT, PROT, ALBUMIN in the last 168 hours. CBG: No results for input(s): GLUCAP in the last 168 hours.  Discharge time spent: greater than 30 minutes.  Signed: Elidia Toribio Furnace, MD [facility] 01/16/2024

## 2024-01-17 LAB — LIPOPROTEIN A (LPA): Lipoprotein (a): 17.8 nmol/L (ref <75.0)

## 2024-01-23 ENCOUNTER — Telehealth (HOSPITAL_COMMUNITY): Payer: Self-pay

## 2024-01-23 NOTE — Telephone Encounter (Signed)
 Pt is not interested in the cardiac rehab program. Pt stated that it doesn't work for her.   Closed referral.

## 2024-01-24 ENCOUNTER — Encounter: Payer: Self-pay | Admitting: Acute Care

## 2024-01-25 DIAGNOSIS — I10 Essential (primary) hypertension: Secondary | ICD-10-CM | POA: Diagnosis not present

## 2024-01-25 DIAGNOSIS — Z0000 Encounter for general adult medical examination without abnormal findings: Secondary | ICD-10-CM | POA: Diagnosis not present

## 2024-01-25 DIAGNOSIS — I2510 Atherosclerotic heart disease of native coronary artery without angina pectoris: Secondary | ICD-10-CM | POA: Diagnosis not present

## 2024-01-25 DIAGNOSIS — Z8673 Personal history of transient ischemic attack (TIA), and cerebral infarction without residual deficits: Secondary | ICD-10-CM | POA: Diagnosis not present

## 2024-01-25 DIAGNOSIS — R42 Dizziness and giddiness: Secondary | ICD-10-CM | POA: Diagnosis not present

## 2024-01-25 DIAGNOSIS — M85852 Other specified disorders of bone density and structure, left thigh: Secondary | ICD-10-CM | POA: Diagnosis not present

## 2024-01-25 DIAGNOSIS — G4700 Insomnia, unspecified: Secondary | ICD-10-CM | POA: Diagnosis not present

## 2024-01-25 DIAGNOSIS — E7800 Pure hypercholesterolemia, unspecified: Secondary | ICD-10-CM | POA: Diagnosis not present

## 2024-01-25 DIAGNOSIS — E559 Vitamin D deficiency, unspecified: Secondary | ICD-10-CM | POA: Diagnosis not present

## 2024-02-02 ENCOUNTER — Encounter: Payer: Self-pay | Admitting: Cardiology

## 2024-02-02 DIAGNOSIS — E7800 Pure hypercholesterolemia, unspecified: Secondary | ICD-10-CM | POA: Diagnosis not present

## 2024-02-02 DIAGNOSIS — I10 Essential (primary) hypertension: Secondary | ICD-10-CM | POA: Diagnosis not present

## 2024-02-10 ENCOUNTER — Other Ambulatory Visit (HOSPITAL_COMMUNITY): Payer: Self-pay

## 2024-02-11 ENCOUNTER — Encounter (HOSPITAL_COMMUNITY): Payer: Self-pay

## 2024-02-11 ENCOUNTER — Emergency Department (HOSPITAL_COMMUNITY)

## 2024-02-11 ENCOUNTER — Emergency Department (HOSPITAL_COMMUNITY)
Admission: EM | Admit: 2024-02-11 | Discharge: 2024-02-11 | Disposition: A | Discharge status: Home / Self Care | Attending: Emergency Medicine | Admitting: Emergency Medicine

## 2024-02-11 ENCOUNTER — Other Ambulatory Visit: Payer: Self-pay

## 2024-02-11 DIAGNOSIS — Z7902 Long term (current) use of antithrombotics/antiplatelets: Secondary | ICD-10-CM | POA: Insufficient documentation

## 2024-02-11 DIAGNOSIS — S0083XA Contusion of other part of head, initial encounter: Secondary | ICD-10-CM | POA: Diagnosis not present

## 2024-02-11 DIAGNOSIS — I700 Atherosclerosis of aorta: Secondary | ICD-10-CM | POA: Diagnosis not present

## 2024-02-11 DIAGNOSIS — E871 Hypo-osmolality and hyponatremia: Secondary | ICD-10-CM | POA: Diagnosis not present

## 2024-02-11 DIAGNOSIS — R55 Syncope and collapse: Secondary | ICD-10-CM | POA: Diagnosis not present

## 2024-02-11 DIAGNOSIS — Y92002 Bathroom of unspecified non-institutional (private) residence single-family (private) house as the place of occurrence of the external cause: Secondary | ICD-10-CM | POA: Insufficient documentation

## 2024-02-11 DIAGNOSIS — S069X9A Unspecified intracranial injury with loss of consciousness of unspecified duration, initial encounter: Secondary | ICD-10-CM | POA: Diagnosis not present

## 2024-02-11 DIAGNOSIS — W19XXXA Unspecified fall, initial encounter: Secondary | ICD-10-CM | POA: Diagnosis not present

## 2024-02-11 DIAGNOSIS — W0110XA Fall on same level from slipping, tripping and stumbling with subsequent striking against unspecified object, initial encounter: Secondary | ICD-10-CM | POA: Diagnosis not present

## 2024-02-11 DIAGNOSIS — I2510 Atherosclerotic heart disease of native coronary artery without angina pectoris: Secondary | ICD-10-CM | POA: Insufficient documentation

## 2024-02-11 DIAGNOSIS — R42 Dizziness and giddiness: Secondary | ICD-10-CM

## 2024-02-11 DIAGNOSIS — Z7982 Long term (current) use of aspirin: Secondary | ICD-10-CM | POA: Insufficient documentation

## 2024-02-11 LAB — CBC WITH DIFFERENTIAL/PLATELET
Abs Immature Granulocytes: 0.01 K/uL (ref 0.00–0.07)
Basophils Absolute: 0 K/uL (ref 0.0–0.1)
Basophils Relative: 1 %
Eosinophils Absolute: 0.1 K/uL (ref 0.0–0.5)
Eosinophils Relative: 2 %
HCT: 40.8 % (ref 36.0–46.0)
Hemoglobin: 13.7 g/dL (ref 12.0–15.0)
Immature Granulocytes: 0 %
Lymphocytes Relative: 11 %
Lymphs Abs: 0.7 K/uL (ref 0.7–4.0)
MCH: 28.5 pg (ref 26.0–34.0)
MCHC: 33.6 g/dL (ref 30.0–36.0)
MCV: 85 fL (ref 80.0–100.0)
Monocytes Absolute: 0.4 K/uL (ref 0.1–1.0)
Monocytes Relative: 7 %
Neutro Abs: 4.5 K/uL (ref 1.7–7.7)
Neutrophils Relative %: 79 %
Platelets: 176 K/uL (ref 150–400)
RBC: 4.8 MIL/uL (ref 3.87–5.11)
RDW: 13 % (ref 11.5–15.5)
WBC: 5.8 K/uL (ref 4.0–10.5)
nRBC: 0 % (ref 0.0–0.2)

## 2024-02-11 LAB — BASIC METABOLIC PANEL WITH GFR
Anion gap: 10 (ref 5–15)
BUN: 23 mg/dL (ref 8–23)
CO2: 22 mmol/L (ref 22–32)
Calcium: 9.5 mg/dL (ref 8.9–10.3)
Chloride: 99 mmol/L (ref 98–111)
Creatinine, Ser: 0.94 mg/dL (ref 0.44–1.00)
GFR, Estimated: >60 mL/min
Glucose, Bld: 111 mg/dL — ABNORMAL HIGH (ref 70–99)
Potassium: 4 mmol/L (ref 3.5–5.1)
Sodium: 131 mmol/L — ABNORMAL LOW (ref 135–145)

## 2024-02-11 LAB — TROPONIN I (HIGH SENSITIVITY): Troponin I (High Sensitivity): 4 ng/L

## 2024-02-11 MED ORDER — SODIUM CHLORIDE 0.9 % IV BOLUS
500.0000 mL | Freq: Once | INTRAVENOUS | Status: AC
  Administered 2024-02-11: 500 mL via INTRAVENOUS

## 2024-02-11 NOTE — Progress Notes (Signed)
 Orthopedic Tech Progress Note Patient Details:  Jaclyn Golden 1950/08/29 [id number]  Patient ID: Jaclyn Golden, female   DOB: 09/24/1950, 73 y.o.   MRN: [id number] Level II; not currently needed. Jaclyn Golden 02/11/2024, 11:16 AM

## 2024-02-11 NOTE — ED Triage Notes (Signed)
 Patient BIB EMS from home after a syncope fall this morning when she got up to use the bathroom. Patient states that she was feeling dizzy last night, but does have a history of vertigo. Patient had LOC and hit head upon fall. Patient remembers falling and then waking up on the bathroom floor. Patient did have some stents placed 3 weeks ago.

## 2024-02-11 NOTE — ED Provider Notes (Signed)
 [place] EMERGENCY DEPARTMENT AT [facility] Provider Note   CSN: [id number] Arrival date & time: 02/11/24  1108     Patient presents with: Jaclyn Golden is a 73 y.o. female w/ hx or CAD s/p stent, on aspirin  and plavex, hs of orthostatic hypotension and vertigo, here with near syncope.  Patient reports she got up at night to try to pee and felt very dizzy - which is typical for her.  She fell in the bathroom and hit her left forehead.  Unsure if she had LOC briefly.  Denies any significant body pain.  Denies chest pain, pressure, or SOB.  Had a cardiac stent and LHC on 01/15/24, with mid lad 70% stenosis.  Echo on 01/13/24 showing EF 65-70% and no significant valvular disease noted.   HPI     Prior to Admission medications   Medication Sig Start Date End Date Taking? Authorizing Provider  aspirin  EC 81 MG tablet Take 1 tablet (81 mg total) by mouth daily. Swallow whole. 01/16/24   Arrien, Mauricio Daniel, MD  clopidogrel  (PLAVIX ) 75 MG tablet Take 1 tablet (75 mg total) by mouth daily with breakfast. 01/17/24   Arrien, Elidia Sieving, MD  Coenzyme Q10 100 MG capsule Take 100 mg by mouth daily.    [provider]  hydrochlorothiazide (HYDRODIURIL) 25 MG tablet Take 1 tablet (25 mg total) by mouth daily. 02/02/24   Anner Alm ORN, MD  meclizine  (ANTIVERT ) 25 MG tablet Take 1 tablet (25 mg total) by mouth 3 (three) times daily as needed for dizziness. 01/15/19   Knapp, Iva, MD  Multiple Vitamins-Minerals (PRESERVISION AREDS 2 PO) Take 1 capsule by mouth in the morning and at bedtime.    [provider]  rosuvastatin  (CRESTOR ) 10 MG tablet Take 1 tablet (10 mg total) by mouth daily. 02/02/24   Anner Alm ORN, MD  telmisartan (MICARDIS) 20 MG tablet Take 20 mg by mouth daily. 01/01/24   [provider]  Vitamin D-Vitamin K (VITAMIN K2-VITAMIN D3 PO) Take 1 capsule by mouth daily at 12 noon.    [provider]    Allergies: Penicillins     Review of Systems  Updated Vital Signs BP 138/62 (BP Location: Right Arm)   Pulse 66   Temp 98.2 F (36.8 C) (Oral)   Resp 14   Ht 5' 2 (1.575 m)   Wt 74.4 kg   SpO2 97%   BMI 30.00 kg/m   Physical Exam Constitutional:      General: She is not in acute distress. HENT:     Head: Normocephalic.     Comments: Contusion left forehead Eyes:     Conjunctiva/sclera: Conjunctivae normal.     Pupils: Pupils are equal, round, and reactive to light.  Cardiovascular:     Rate and Rhythm: Normal rate and regular rhythm.  Pulmonary:     Effort: Pulmonary effort is normal. No respiratory distress.  Abdominal:     General: There is no distension.     Tenderness: There is no abdominal tenderness.  Skin:    General: Skin is warm and dry.  Neurological:     General: No focal deficit present.     Mental Status: She is alert. Mental status is at baseline.  Psychiatric:        Mood and Affect: Mood normal.        Behavior: Behavior normal.     (all labs ordered are listed, but only abnormal results are displayed)  Labs Reviewed  BASIC METABOLIC PANEL WITH GFR - Abnormal; Notable for the following components:      Result Value   Sodium 131 (*)    Glucose, Bld 111 (*)    All other components within normal limits  CBC WITH DIFFERENTIAL/PLATELET  TROPONIN I (HIGH SENSITIVITY)    EKG: EKG Interpretation Date/Time:  Sunday February 11 2024 11:23:35 EDT Ventricular Rate:  72 PR Interval:  149 QRS Duration:  81 QT Interval:  390 QTC Calculation: 427 R Axis:   -17  Text Interpretation: Sinus rhythm Borderline left axis deviation Confirmed by Cottie Cough [phone]) on 02/11/2024 11:46:27 AM  Radiology: [facility] Chest Portable 1 View Result Date: 02/11/2024 CLINICAL DATA:  73 year old female status post syncope and fall this morning. Dizziness. Loss of consciousness, struck head. EXAM: PORTABLE CHEST 1 VIEW COMPARISON:  Chest radiograph 01/12/2024 and earlier. FINDINGS: Portable AP semi  upright view at 1120 hours. Lung volumes and mediastinal contours remain normal. Calcified aortic atherosclerosis. Visualized tracheal air column is within normal limits. Allowing for portable technique the lungs are clear. No pneumothorax or pleural effusion. Negative visible bowel gas. No acute osseous abnormality identified. IMPRESSION: No acute cardiopulmonary abnormality or acute traumatic injury identified. Electronically Signed   By: VEAR Hurst M.D.   On: 02/11/2024 12:04   CT Head Wo Contrast Result Date: 02/11/2024 CLINICAL DATA:  73 year old female status post syncope and fall this morning. Dizziness. Loss of consciousness, struck head. EXAM: CT HEAD WITHOUT CONTRAST TECHNIQUE: Contiguous axial images were obtained from the base of the skull through the vertex without intravenous contrast. RADIATION DOSE REDUCTION: This exam was performed according to the departmental dose-optimization program which includes automated exposure control, adjustment of the mA and/or kV according to patient size and/or use of iterative reconstruction technique. COMPARISON:  Brain MRI 10/30/2019.  Head CT 10/29/2019. FINDINGS: Brain: Stable cerebral volume. No midline shift, ventriculomegaly, mass effect, evidence of mass lesion, intracranial hemorrhage or evidence of cortically based acute infarction. Left parahippocampal or choroidal fissure cyst, normal variant, and unchanged since 2015. Chronic patchy periventricular white matter hypodensity is stable, mild for age. Small chronic left cerebellar infarct (series 3, image 8) is stable. Vascular: Calcified atherosclerosis at the skull base. No suspicious intracranial vascular hyperdensity. Skull: Appears stable and intact. Sinuses/Orbits: Scattered mild paranasal sinus mucosal thickening. Tympanic cavities and mastoids appear clear. Other: No acute orbit or scalp soft tissue injury identified. IMPRESSION: 1. No acute intracranial abnormality or acute traumatic injury  identified. 2. 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 Electronically Signed   By: VEAR Hurst M.D.   On: 02/11/2024 12:03     Procedures   Medications Ordered in the ED  sodium chloride  0.9 % bolus 500 mL (0 mLs Intravenous Stopped 02/11/24 1414)    Clinical Course as of 02/11/24 1450  Sun Feb 11, 2024  1208 Ortho VS largely unremarkable [MT]    Clinical Course User Index [MT] Tukker Byrns, Cough PARAS, MD                                 Medical Decision Making Amount and/or Complexity of Data Reviewed Labs: ordered. Radiology: ordered. ECG/medicine tests: ordered.   This patient presents to the ED with concern for near syncope. This involves an extensive number of treatment options, and is a complaint that carries with it a high risk of complications and morbidity.  The differential diagnosis includes vertigo vs orthostasis vs arrhythmia vs other  No chest pressure or pain - we'll check a troponin and trend if needed, given recent cardiac surgery, but this would be quite atypical for ACS  Co-morbidities that complicate the patient evaluation: CV risk factors and known CAD  Additional history obtained from EMS  External records from outside source obtained and reviewed including LHC and echo report  I ordered and personally interpreted labs.  The pertinent results include: Mild hyponatremia, no other emergent findings  I ordered imaging studies including CT head, xray chest I independently visualized and interpreted imaging which showed no emergent finding I agree with the radiologist interpretation  The patient was maintained on a cardiac monitor.  I personally viewed and interpreted the cardiac monitored which showed an underlying rhythm of: Sinus rhythm  Per my interpretation the patient's ECG shows sinus rhythm no acute ischemic findings  I ordered medication including IV saline bolus for mild hyponatremia  and hydration  I have reviewed the patients home medicines and have made adjustments as needed  Test Considered: low suspicion for C spine or other fracture or traumatic injury per exam.  Doubt acute PE, sepsis, CVA   After the interventions noted above, I reevaluated the patient and found that they have: improved   I suspect the patient's episode today was most likely related to her chronic underlying conditions, per her known history as well as her family support at the bedside.  We discussed taking her time when standing up especially from bed at night.  She verbalized understanding.  Okay for discharge  Disposition:  After consideration of the diagnostic results and the patient's response to treatment, I feel that the patient would benefit from close outpatient follow-up.      Final diagnoses:  Vertigo  Near syncope    ED Discharge Orders     None          Cottie Donnice PARAS, MD 02/11/24 1450

## 2024-02-12 ENCOUNTER — Telehealth: Payer: Self-pay | Admitting: Cardiology

## 2024-02-12 MED ORDER — ASPIRIN 81 MG PO TBEC: 81.0000 mg | DELAYED_RELEASE_TABLET | Freq: Every day | ORAL | 0 refills | Status: AC

## 2024-02-12 MED ORDER — CLOPIDOGREL BISULFATE 75 MG PO TABS: 75.0000 mg | ORAL_TABLET | Freq: Every day | ORAL | 0 refills | Status: DC

## 2024-02-12 NOTE — Telephone Encounter (Signed)
*  STAT* If patient is at the pharmacy, call can be transferred to refill team.   1. Which medications need to be refilled? (please list name of each medication and dose if known) clopidogrel  (PLAVIX ) 75 MG tablet aspirin  EC 81 MG tablet    2. Would you like to learn more about the convenience, safety, & potential cost savings by using the [facility]? No     3. Are you open to using the [facility] (Type [facility]. ). No   4. Which pharmacy/location (including street and city if local pharmacy) is medication to be sent to? [id number] - [place], [place] - [address] AT [address]   5. Do they need a 30 day or 90 day supply? 90 day

## 2024-02-12 NOTE — Telephone Encounter (Signed)
 RX sent in

## 2024-04-09 ENCOUNTER — Other Ambulatory Visit: Payer: Self-pay | Admitting: Physician Assistant

## 2024-04-11 NOTE — Telephone Encounter (Signed)
 Pt has an upcoming appt with Dr. Anner on 04/23/2024, a new pt, requesting a refill on clopidogrel . Would Dr. Anner like to refill medication until appt time? Please address

## 2024-04-14 ENCOUNTER — Encounter: Payer: Self-pay | Admitting: Cardiology

## 2024-04-15 MED ORDER — CLOPIDOGREL BISULFATE 75 MG PO TABS: 75.0000 mg | ORAL_TABLET | Freq: Every day | ORAL | 0 refills | Status: DC

## 2024-04-22 ENCOUNTER — Encounter: Payer: Self-pay | Admitting: Cardiology

## 2024-04-23 ENCOUNTER — Ambulatory Visit: Attending: Cardiology | Admitting: Cardiology

## 2024-04-23 ENCOUNTER — Encounter: Payer: Self-pay | Admitting: Cardiology

## 2024-04-23 VITALS — BP 134/68 | HR 72 | Ht 62.0 in | Wt 165.0 lb

## 2024-04-23 DIAGNOSIS — T466X5D Adverse effect of antihyperlipidemic and antiarteriosclerotic drugs, subsequent encounter: Secondary | ICD-10-CM

## 2024-04-23 DIAGNOSIS — I200 Unstable angina: Secondary | ICD-10-CM | POA: Diagnosis not present

## 2024-04-23 DIAGNOSIS — I2510 Atherosclerotic heart disease of native coronary artery without angina pectoris: Secondary | ICD-10-CM | POA: Diagnosis not present

## 2024-04-23 DIAGNOSIS — E785 Hyperlipidemia, unspecified: Secondary | ICD-10-CM

## 2024-04-23 DIAGNOSIS — T466X5A Adverse effect of antihyperlipidemic and antiarteriosclerotic drugs, initial encounter: Secondary | ICD-10-CM | POA: Diagnosis not present

## 2024-04-23 DIAGNOSIS — I10 Essential (primary) hypertension: Secondary | ICD-10-CM

## 2024-04-23 DIAGNOSIS — G720 Drug-induced myopathy: Secondary | ICD-10-CM

## 2024-04-23 DIAGNOSIS — F17200 Nicotine dependence, unspecified, uncomplicated: Secondary | ICD-10-CM

## 2024-04-23 DIAGNOSIS — Z9861 Coronary angioplasty status: Secondary | ICD-10-CM

## 2024-04-23 LAB — LIPID PANEL

## 2024-04-23 MED ORDER — ROSUVASTATIN CALCIUM 5 MG PO TABS: 5.0000 mg | ORAL_TABLET | Freq: Every day | ORAL | 3 refills | Status: AC

## 2024-04-23 MED ORDER — EZETIMIBE 10 MG PO TABS: 10.0000 mg | ORAL_TABLET | Freq: Every day | ORAL | 3 refills | Status: AC

## 2024-04-23 NOTE — Progress Notes (Signed)
 " Cardiology Office Note:  .   Date:  04/26/2024  ID:  [name], DOB [birth year], MRN [id number] PCP: Seabron Lenis, MD  [facility] Providers Cardiologist:  Lenis Clay, MD     Chief Complaint  Patient presents with   Hospitalization Follow-up    Patient is strictly new based on this is the first consultation with her although I did perform her PCI, no formal consultation performed.  Patient specifically requested transfer from Dr. Levern to Dr. Rowland because I performed her PCI.    Patient Profile: .     Jaclyn Golden is a  73 y.o. female former smoker (quit 2021 with a PMH notable for CAD-PCI, HTN, HLD and emphysema who presents here TO ESTABLISH PRIMARY CARDIOLOGY - TRANSFER FROM IN-PATIENT (Dr. Levern).  Referred at the request of Seabron Lenis, MD.  PMH: CAD/Unstable Angina (January 12, 2024): Proximal to mid LAD 70%-DES PCI Emphysema HLD HTN Obesity class I Former smoker     [name] was admitted to [facility] from July 25-29, 2025 with symptoms concerning for ACS/unstable angina.  She had precordial chest pain that was severe and constant.  EKG was relatively insignificant, and she ruled out for MI.  She was seen in consultation by Dr. Levern who recommended coronary CTA which revealed severe mid LAD stenosis.  She underwent cardiac catheterization on 28 July which revealed 40% and 70% mid LAD lesions involving both D1 and D2 branches with both lesions treated with a 1 single stent.  Echo was unremarkable.  She was discharged on ASA/Plavix  DAPT along with metoprolol  and increased dose of statin to 40 mg rosuvastatin .  She went to the emergency room on August 24 for an episode of vertigo.  Symptoms were thought to be related to orthostatic hypotension versus vertigo she got up to go to urinate Night and felt very dizzy.  She fell and hit her forehead.  Unclear if there is any true syncope.  Denied any chest pain.  Subjective  Discussed  the use of AI scribe software for clinical note transcription with the patient, who gave verbal consent to proceed.  History of Present Illness Jaclyn Golden is a 73 year old female with coronary artery disease who presents with fatigue and leg pain. She is accompanied by her son-in-law, Bard.  She experiences persistent fatigue, which she suspects may be related to her blood thinners. Her daughter, who also had a stent placed, experiences similar fatigue. She has not experienced any chest pain recently.  She experiences significant leg pain when standing or walking for extended periods, as well as back pain. She associates the onset of these symptoms with starting rosuvastatin . Initially, her dose was reduced to 5 mg, which alleviated the pain, but she is currently taking 10 mg and the pain has returned.  She has a history of coronary artery disease and underwent a coronary CT scan after her son died of a heart attack at 23 and four brothers died of heart attacks, with one dying of a stroke. She underwent a coronary CT scan after her son died of a heart attack at 64 and four brothers died of heart attacks, with one dying of a stroke. She did not have prior chest pain or pressure before the scan and stent placement.  She has experienced episodes of syncope, notably after starting blood thinners. She passed out three times, with the last incident occurring in July at home, which she attributes to getting up  too quickly. She has not had any recent episodes of dizziness or syncope.  No recent chest pain, pressure, shortness of breath, heart palpitations, blood in stools, or epistaxis. She has a history of high blood pressure but denies any low blood pressure readings.  Her current medications include rosuvastatin  10 mg, telmisartan 20 mg, HCTZ 25 mg, aspirin , Plavix , and CoQ10. She previously took metoprolol  but it was discontinued due to fatigue.  She has a history of smoking but quit years ago. She  bruises easily, which she attributes to her fair skin, and notes increased bruising since starting blood thinners.     Objective   Current Meds  Medication Sig   aspirin  EC 81 MG tablet Take 1 tablet (81 mg total) by mouth daily. Swallow whole.   clopidogrel  (PLAVIX ) 75 MG tablet Take 1 tablet (75 mg total) by mouth daily with breakfast.   hydrochlorothiazide  (HYDRODIURIL ) 25 MG tablet Take 1 tablet (25 mg total) by mouth daily.   meclizine  (ANTIVERT ) 25 MG tablet Take 1 tablet (25 mg total) by mouth 3 (three) times daily as needed for dizziness.   Multiple Vitamins-Minerals (PRESERVISION AREDS 2 PO) Take 1 capsule by mouth in the morning and at bedtime.   telmisartan (MICARDIS) 20 MG tablet Take 20 mg by mouth daily.   []  rosuvastatin  (CRESTOR ) 10 MG tablet Take 1 tablet (10 mg total) by mouth daily.:  Patient actually taking one half tab   - Metoprolol  was discontinued, and rosuvastatin  reduced to 10 mg which she still does not tolerate.  Zetia  was added by PCP.  SH: Former smoker-quit October 2021.  No alcohol.  Drinks caffeine.  Does three-step 3 times a week.  Widowed mother of 3 (1 son and 2 daughters.  Studies Reviewed: SABRA   EKG Interpretation Date/Time:  Tuesday April 23 2024 10:08:18 EST Ventricular Rate:  73 PR Interval:  138 QRS Duration:  72 QT Interval:  374 QTC Calculation: 412 R Axis:   -11  Text Interpretation: Normal sinus rhythm Normal ECG When compared with ECG of 11-Feb-2024 11:23, PREVIOUS ECG IS PRESENT Confirmed by Anner Lenis ([id number]) on 04/23/2024 10:40:26 AM   Lab Results  Component Value Date   CHOL 163 04/23/2024   HDL 43 04/23/2024   LDLCALC 95 04/23/2024   TRIG 141 04/23/2024   CHOLHDL 3.8 04/23/2024   No results found for: HGBA1C Lab Results  Component Value Date   NA 131 (L) 02/11/2024   K 4.0 02/11/2024   CREATININE 0.94 02/11/2024   GFRNONAA >60 02/11/2024   GLUCOSE 111 (H) 02/11/2024   Lab Results  Component Value Date   ALT 19  04/23/2024   AST 25 04/23/2024   ALKPHOS 112 04/23/2024   BILITOT 0.5 04/23/2024    Results LABS LDL: 77 (12/2023)  RADIOLOGY Coronary CTA: CAC 450.  Total plaque volume 582-severe.  Calcified and noncalcified.  Severe> 70% stenosis in the mid LAD with mild (25 to 49%) OM1 stenosis.  FFR ct: Mid LAD 0.7.  All of his not significant.  (01/11/2024)   DIAGNOSTIC/intervention Echocardiogram: Normal, 65-70% ejection fraction, no wall motion abnormalities, indeterminate relaxation, normal pulmonary pressures, mitral valve calcium  buildup without narrowing or leaking, aortic valve sclerosis without narrowing, normal right atrial pressures (01/13/2024) CATH - PCI: Culprit Lesion = mid LAD 40% and 70% involving D1 and D2 => DES PCI of mid LAD crossing D1 and D2 using Onyx Frontier DES 2.5 mm at 26 mm tapered postdilation from 2.75 to 2.5 mm.  0% residual.  40% ostial D2 residual.   Otherwise normal coronary arteries.  Normal LVEDP.  (01/15/2024) Diagnostic    Dominance: Co-dominant                                                Intervention   Risk Assessment/Calculations:            Physical Exam:   VS:  BP 134/68   Pulse 72   Ht 5' 2 (1.575 m)   Wt 165 lb (74.8 kg)   SpO2 95%   BMI 30.18 kg/m    Wt Readings from Last 3 Encounters:  04/23/24 165 lb (74.8 kg)  02/11/24 164 lb (74.4 kg)  01/16/24 165 lb 2 oz (74.9 kg)     GEN: Well nourished, well GROOMED; in no acute distress; mildly obese NECK: No JVD; No carotid bruits CARDIAC: Normal S1, S2; RRR, no murmurs, rubs, gallops RESPIRATORY:  Clear to auscultation without rales, wheezing or rhonchi ; nonlabored, good air movement. ABDOMEN: Soft, non-tender, non-distended EXTREMITIES:  No edema; No deformity      ASSESSMENT AND PLAN: .    Problem List Items Addressed This Visit       Cardiology Problems   CAD S/P DES PCI to LAD (Chronic)   S/p DES PCI of LAD:  Echocardiogram shows normal function.  Statin  Intolerant Persistent Fatigue limits medical management. Discussed cholesterol management and antiplatelet therapy to prevent further stenosis and stent thrombosis. For now continue DAPT (81 mg aspirin , send 5 mg Plavix ) to complete 6 months treatment (February 2026) - Continue Plavix  indefinitely; but okay to interrupt as of February 2026 - Discontinue aspirin  after six months.  (February 2026) - Monitor for symptoms of bleeding or anemia. => Check CBC, TSH due to fatigue Beta-blocker stopped presumably because of fatigue.  Continue low-dose Micardis with well-controlled home blood pressures Statin intolerance-now on co-Q10 100 mg daily and 5 mg rosuvastatin  Continue even low-dose rosuvastatin  starting 5 mg every the day and increase to daily if possible Add Zetia  10 mg Check lipid panel Refer to [facility] lipid clinic for additional management pending labs.       Relevant Medications   ezetimibe  (ZETIA ) 10 MG tablet   rosuvastatin  (CRESTOR ) 5 MG tablet   Other Relevant Orders   Hepatic function panel (Completed)   Lipid panel (Completed)   CBC (Completed)   TSH (Completed)   Hepatic function panel   Lipid panel   AMB Referral to [facility]   Essential (primary) hypertension - Primary (Chronic)   Blood pressure slightly elevated with white coat syndrome.  Current medications include telmisartan 20 mg and HCTZ 25 mg. -> Beta-blocker discontinued due to fatigue.  Discussed monitoring and medication adjustment. - Continue telmisartan and HCTZ at current doses.      Relevant Medications   ezetimibe  (ZETIA ) 10 MG tablet   rosuvastatin  (CRESTOR ) 5 MG tablet   Other Relevant Orders   EKG 12-Lead (Completed)   TSH (Completed)   Hyperlipidemia with target low density lipoprotein (LDL) cholesterol less than 55 mg/dL (Chronic)   Hyperlipidemia with statin-induced myopathy Intolerance to rosuvastatin  10 mg due to myopathy. Previous LDL was 77 mg/dL. Discussed alternative therapies  and potential referral to lipid clinic if LDL goals are unmet. Emphasized LDL target below 70 mg/dL, ideally less than 55 mg/dL. - Reduced rosuvastatin  to 5 mg. - Added Zetia   10 mg. - Ordered lipid panel, liver function tests, CBC, and TSH. - Recheck lipid levels in January. - Consider referral to lipid clinic if LDL goals are unmet.      Relevant Medications   ezetimibe  (ZETIA ) 10 MG tablet   rosuvastatin  (CRESTOR ) 5 MG tablet   Other Relevant Orders   Hepatic function panel (Completed)   Lipid panel (Completed)   CBC (Completed)   Hepatic function panel   Lipid panel   AMB Referral to [facility] Pharm[facility]   Unstable angina (HCC)   No further chest pain following PCI.  Just feels fatigued.      Relevant Medications   ezetimibe  (ZETIA ) 10 MG tablet   rosuvastatin  (CRESTOR ) 5 MG tablet   Other Relevant Orders   Hepatic function panel (Completed)   Lipid panel (Completed)   CBC (Completed)   TSH (Completed)   Hepatic function panel   Lipid panel     Other   Statin myopathy (Chronic)   She has previously been intolerant of atorvastatin as well as simvastatin in the distant history but now has been on rosuvastatin  that was bumped up to 40 mg and she could not tolerate even that.  She actually is not even taking the full 10 mg tablets.  See plan for lipids. Adding Zetia , rechecking labs and referring to [facility].  Perhaps Leqvio would be good option for her.      Relevant Orders   Hepatic function panel (Completed)   Lipid panel (Completed)   CBC (Completed)   TSH (Completed)   Hepatic function panel   Lipid panel   AMB [facility] to [facility]   Tobacco dependence (Chronic)   Former smoker.  Congratulated her efforts.           Follow-Up: Return in about 4 months (around 08/21/2024) for Routine follow up with me, To discuss test results, Cholesterol F/u with [facility].  I spent 56 minutes in the care of MARRIAN BELLS today including reviewing labs (2 minutes),  reviewing studies (Coronary CTA and echo results reviewed as well as cath films-6 minutes), face to face time discussing treatment options (29 minutes), reviewing records from hospitalization H&P, discharge summary and consult note from Dr. Levern.   (8 minutes), 11 minutes dictating, and documenting in the encounter.      Signed, Alm MICAEL Clay, MD, MS [name], M.D., M.S. Interventional Cardiologist  [facility] Pager # [phone]      "

## 2024-04-23 NOTE — Patient Instructions (Addendum)
 Medication Instructions:   STOP TAKING 81 MG ASPIRIN   Jul 21, 2024  DECREASE ROSUVASTATIN  TO 5 MG  DAILY - NEW PRESCRIPTION HAS BEEN SENT TO PHARMACY  START ZETIA 10 MG DAILY  *If you need a refill on your cardiac medications before your next appointment, please call your pharmacy*   Lab Work:FASTING - TODAY  LIPID  LIVER PANEL' CBC  TSH   3 MONTHS - FEB 2026 FASTING -- DO PRIOR TO YOUR APPOINTMENT WITH [facility] PHARM LIPID CLINIC LIPID  LIVER PANEL  If you have labs (blood work) drawn today and your tests are completely normal, you will receive your results only by: MyChart Message (if you have MyChart) OR A paper copy in the mail If you have any lab test that is abnormal or we need to change your treatment, we will call you to review the results.   Testing/Procedures: NOT NEEDED    Follow-Up: At [facility], you and your health needs are our priority.  As part of our continuing mission to provide you with exceptional heart care, we have created designated Provider Care Teams.  These Care Teams include your primary Cardiologist (physician) and Advanced Practice Providers (APPs -  Physician Assistants and Nurse Practitioners) who all work together to provide you with the care you need, when you need it.     Your next appointment:   4 month(s)  The format for your next appointment:   In Person  Provider:   [name], MD  You have been referred to [facility] 3 month ( feb 2026)-- discuss cholesterol and medication

## 2024-04-24 LAB — CBC
Hematocrit: 41.3 % (ref 34.0–46.6)
Hemoglobin: 13.4 g/dL (ref 11.1–15.9)
MCH: 28.8 pg (ref 26.6–33.0)
MCHC: 32.4 g/dL (ref 31.5–35.7)
MCV: 89 fL (ref 79–97)
Platelets: 197 x10E3/uL (ref 150–450)
RBC: 4.65 x10E6/uL (ref 3.77–5.28)
RDW: 13.7 % (ref 11.7–15.4)
WBC: 6.1 x10E3/uL (ref 3.4–10.8)

## 2024-04-24 LAB — LIPID PANEL
Cholesterol, Total: 163 mg/dL (ref 100–199)
HDL: 43 mg/dL (ref >39)
LDL CALC COMMENT:: 3.8 ratio (ref 0.0–4.4)
LDL Chol Calc (NIH): 95 mg/dL (ref 0–99)
Triglycerides: 141 mg/dL (ref 0–149)
VLDL Cholesterol Cal: 25 mg/dL (ref 5–40)

## 2024-04-24 LAB — HEPATIC FUNCTION PANEL
ALT: 19 IU/L (ref 0–32)
AST: 25 IU/L (ref 0–40)
Albumin: 4.6 g/dL (ref 3.8–4.8)
Alkaline Phosphatase: 112 IU/L (ref 49–135)
Bilirubin Total: 0.5 mg/dL (ref 0.0–1.2)
Bilirubin, Direct: 0.18 mg/dL (ref 0.00–0.40)
Total Protein: 7.3 g/dL (ref 6.0–8.5)

## 2024-04-24 LAB — TSH: TSH: 1.01 u[IU]/mL (ref 0.450–4.500)

## 2024-04-25 NOTE — Progress Notes (Incomplete)
 Cardiology Office Note:  .   Date:  04/23/2024  ID:  SELINA TAPPER, DOB 01/10/1951, MRN [id number] PCP: Seabron Lenis, MD  [facility] Providers Cardiologist:  Lenis Clay, MD { Click to update primary MD,subspecialty MD or APP then REFRESH:1}    No chief complaint on file.   Patient Profile: .     ANALYSE Jaclyn Golden is a  73 y.o. female former smoker (quit 2021 with a PMH notable for CAD-PCI, HTN, HLD and emphysema who presents here for *** at the request of Seabron Lenis, MD.  PMH: CAD/Unstable Angina (January 12, 2024): Proximal to mid LAD 70%-DES PCI Emphysema HLD HTN Obesity class I Former smoker     Jaclyn Golden was admitted to [facility] from July 25-29, 2025 with symptoms concerning for ACS/unstable angina.  She had precordial chest pain that was severe and constant.  EKG was relatively insignificant, and she ruled out for MI.  She was seen in consultation by Dr. Levern who recommended coronary CTA which revealed severe mid LAD stenosis.  She underwent cardiac catheterization on 28 July which revealed 40% and 70% mid LAD lesions involving both D1 and D2 branches with both lesions treated with a 1 single stent.  Echo was unremarkable.  She was discharged on ASA/Plavix  DAPT along with metoprolol  and increased dose of statin to 40 mg rosuvastatin .  She went to the emergency room on August 24 for an episode of vertigo.  Symptoms were thought to be related to orthostatic hypotension versus vertigo she got up to go to urinate Night and felt very dizzy.  She fell and hit her forehead.  Unclear if there is any true syncope.  Denied any chest pain.  Subjective  Discussed the use of AI scribe software for clinical note transcription with the patient, who gave verbal consent to proceed.  History of Present Illness Jaclyn Golden is a 73 year old female with coronary artery disease who presents with fatigue and leg pain. She is accompanied by her son-in-law,  Jaclyn Golden.  She experiences persistent fatigue, which she suspects may be related to her blood thinners. Her daughter, who also had a stent placed, experiences similar fatigue. She has not experienced any chest pain recently.  She experiences significant leg pain when standing or walking for extended periods, as well as back pain. She associates the onset of these symptoms with starting rosuvastatin . Initially, her dose was reduced to 5 mg, which alleviated the pain, but she is currently taking 10 mg and the pain has returned.  She has a history of coronary artery disease and underwent a coronary CT scan after her son died of a heart attack at 12 and four brothers died of heart attacks, with one dying of a stroke. She underwent a coronary CT scan after her son died of a heart attack at 109 and four brothers died of heart attacks, with one dying of a stroke. She did not have prior chest pain or pressure before the scan and stent placement.  She has experienced episodes of syncope, notably after starting blood thinners. She passed out three times, with the last incident occurring in July at home, which she attributes to getting up too quickly. She has not had any recent episodes of dizziness or syncope.  No recent chest pain, pressure, shortness of breath, heart palpitations, blood in stools, or epistaxis. She has a history of high blood pressure but denies any low blood pressure readings.  Her current medications include  rosuvastatin  10 mg, Golden 20 mg, HCTZ 25 mg, aspirin , Plavix , and CoQ10. She previously took metoprolol  but it was discontinued due to fatigue.  She has a history of smoking but quit years ago. She bruises easily, which she attributes to her fair skin, and notes increased bruising since starting blood thinners.     Objective   Current Meds  Medication Sig  . aspirin  EC 81 MG tablet Take 1 tablet (81 mg total) by mouth daily. Swallow whole.  . clopidogrel  (PLAVIX ) 75 MG tablet  Take 1 tablet (75 mg total) by mouth daily with breakfast.  . ezetimibe (ZETIA) 10 MG tablet Take 1 tablet (10 mg total) by mouth daily.  . hydrochlorothiazide (HYDRODIURIL) 25 MG tablet Take 1 tablet (25 mg total) by mouth daily.  . meclizine  (ANTIVERT ) 25 MG tablet Take 1 tablet (25 mg total) by mouth 3 (three) times daily as needed for dizziness.  . Multiple Vitamins-Minerals (PRESERVISION AREDS 2 PO) Take 1 capsule by mouth in the morning and at bedtime.  . rosuvastatin  (CRESTOR ) 5 MG tablet Take 1 tablet (5 mg total) by mouth daily.  Jaclyn Golden (MICARDIS) 20 MG tablet Take 20 mg by mouth daily.  . []  rosuvastatin  (CRESTOR ) 10 MG tablet Take 1 tablet (10 mg total) by mouth daily.   - Metoprolol  was discontinued, and rosuvastatin  reduced to 10 mg which she still does not tolerate.  Zetia was added by PCP.  SH: Former smoker-quit October 2021.  No alcohol.  Drinks caffeine.  Does three-step 3 times a week.  Widowed mother of 3 (1 son and 2 daughters.  Studies Reviewed: Jaclyn   EKG Interpretation Date/Time:  Tuesday April 23 2024 10:08:18 EST Ventricular Rate:  73 PR Interval:  138 QRS Duration:  72 QT Interval:  374 QTC Calculation: 412 R Axis:   -11  Text Interpretation: Normal sinus rhythm Normal ECG When compared with ECG of 11-Feb-2024 11:23, PREVIOUS ECG IS PRESENT Confirmed by Anner Lenis ([id number]) on 04/23/2024 10:40:26 AM   Lab Results  Component Value Date   CHOL 135 01/13/2024   HDL 39 (L) 01/13/2024   LDLCALC 77 01/13/2024   TRIG 97 01/13/2024   CHOLHDL 3.5 01/13/2024   No results found for: HGBA1C Lab Results  Component Value Date   NA 131 (L) 02/11/2024   K 4.0 02/11/2024   CREATININE 0.94 02/11/2024   GFRNONAA >60 02/11/2024   GLUCOSE 111 (H) 02/11/2024   Lab Results  Component Value Date   ALT 19 10/29/2019   AST 27 10/29/2019   ALKPHOS 74 10/29/2019   BILITOT 0.7 10/29/2019    Results LABS LDL: 77 (12/2023)  RADIOLOGY Coronary CTA: CAC 450.   Total plaque volume 582-severe.  Calcified and noncalcified.  Severe> 70% stenosis in the mid LAD with mild (25 to 49%) OM1 stenosis.  FFR ct: Mid LAD 0.7.  All of his not significant.  (01/11/2024)   DIAGNOSTIC/intervention Echocardiogram: Normal, 65-70% ejection fraction, no wall motion abnormalities, indeterminate relaxation, normal pulmonary pressures, mitral valve calcium  buildup without narrowing or leaking, aortic valve sclerosis without narrowing, normal right atrial pressures (04/14/2024) CATH - PCI: Culprit Lesion = mid LAD 40% and 70% involving D1 and D2 => DES PCI of mid LAD crossing D1 and D2 using Onyx Frontier DES 2.5 mm at 26 mm tapered postdilation from 2.75 to 2.5 mm.  0% residual.  40% ostial D2 residual.   Otherwise normal coronary arteries.  Normal LVEDP.  (01/15/2024) Diagnostic    Dominance: Co-dominant  Intervention   Risk Assessment/Calculations:            Physical Exam:   VS:  BP 134/68   Pulse 72   Ht 5' 2 (1.575 m)   Wt 165 lb (74.8 kg)   SpO2 95%   BMI 30.18 kg/m    Wt Readings from Last 3 Encounters:  04/23/24 165 lb (74.8 kg)  02/11/24 164 lb (74.4 kg)  01/16/24 165 lb 2 oz (74.9 kg)     GEN: Well nourished, well GROOMED; in no acute distress; mildly obese NECK: No JVD; No carotid bruits CARDIAC: Normal S1, S2; RRR, no murmurs, rubs, gallops RESPIRATORY:  Clear to auscultation without rales, wheezing or rhonchi ; nonlabored, good air movement. ABDOMEN: Soft, non-tender, non-distended EXTREMITIES:  No edema; No deformity      ASSESSMENT AND PLAN: .    Problem List Items Addressed This Visit       Cardiology Problems   CAD S/P percutaneous coronary angioplasty   Essential (primary) hypertension - Primary (Chronic)   Relevant Orders   EKG 12-Lead (Completed)   Hyperlipidemia with target low density lipoprotein (LDL) cholesterol less than 55 mg/dL (Chronic)   Unstable angina (HCC)     Other    Statin myopathy (Chronic)    Assessment and Plan Assessment & Plan Atherosclerotic heart disease of native coronary artery status post stent placement Status post stent placement in the left anterior descending artery with 70% and 40% stenosis. Echocardiogram shows normal function. Discussed cholesterol management and antiplatelet therapy to prevent further stenosis and stent thrombosis. - Continue Plavix  indefinitely. - Discontinue aspirin  after six months. - Monitor for symptoms of bleeding or anemia.  Hyperlipidemia with statin-induced myopathy Intolerance to rosuvastatin  10 mg due to myopathy. Previous LDL was 77 mg/dL. Discussed alternative therapies and potential referral to lipid clinic if LDL goals are unmet. Emphasized LDL target below 70 mg/dL, ideally less than 55 mg/dL. - Reduced rosuvastatin  to 5 mg. - Added Zetia 10 mg. - Ordered lipid panel, liver function tests, CBC, and TSH. - Recheck lipid levels in January. - Consider referral to lipid clinic if LDL goals are unmet.  Essential hypertension Blood pressure slightly elevated with white coat syndrome. Current medications include Golden and HCTZ. Discussed monitoring and medication adjustment. - Continue Golden and HCTZ.  Recording duration: 29 minutes       {Are you ordering a CV Procedure (e.g. stress test, cath, DCCV, TEE, etc)?   Press F2        :[id number]}   Follow-Up: No follow-ups on file.  I spent *** minutes in the care of Leotis KATHEE Moats today including {CHL AMB CAR Time Based Billing Options STW (Optional):[phone]::documenting in the encounter.}      Signed, Alm MICAEL Clay, MD, MS [name], M.D., M.S. Interventional Cardiologist  [facility] Pager # [phone]

## 2024-04-26 ENCOUNTER — Encounter: Payer: Self-pay | Admitting: Cardiology

## 2024-04-26 ENCOUNTER — Ambulatory Visit: Payer: Self-pay | Admitting: Cardiology

## 2024-04-26 DIAGNOSIS — E785 Hyperlipidemia, unspecified: Secondary | ICD-10-CM

## 2024-04-26 NOTE — Assessment & Plan Note (Signed)
 Blood pressure slightly elevated with white coat syndrome.  Current medications include telmisartan 20 mg and HCTZ 25 mg. -> Beta-blocker discontinued due to fatigue.  Discussed monitoring and medication adjustment. - Continue telmisartan and HCTZ at current doses.

## 2024-04-26 NOTE — Progress Notes (Signed)
 Liver function tests look good.  Thyroid  function looks good so that is not the cause for fatigue.  Your blood counts also look good.  Hemoglobin level is 13.4 so you are not anemic.  Cholesterol levels definitely show that you are taking the rosuvastatin  at the higher dose.  The total cholesterol is 163 and the LDL is up to 95.  Try to take at least the 5 mg of rosuvastatin  if only every other day.  I am referring you to our lipid clinic to see if we can get you on a medicine that is not a statin but will get your lipids better.  [name], MD

## 2024-04-26 NOTE — Assessment & Plan Note (Signed)
 Hyperlipidemia with statin-induced myopathy Intolerance to rosuvastatin  10 mg due to myopathy. Previous LDL was 77 mg/dL. Discussed alternative therapies and potential referral to lipid clinic if LDL goals are unmet. Emphasized LDL target below 70 mg/dL, ideally less than 55 mg/dL. - Reduced rosuvastatin  to 5 mg. - Added Zetia 10 mg. - Ordered lipid panel, liver function tests, CBC, and TSH. - Recheck lipid levels in January. - Consider referral to lipid clinic if LDL goals are unmet.

## 2024-04-26 NOTE — Assessment & Plan Note (Signed)
 No further chest pain following PCI.  Just feels fatigued.

## 2024-04-26 NOTE — Assessment & Plan Note (Signed)
 Former smoker.  Congratulated her efforts.

## 2024-04-26 NOTE — Assessment & Plan Note (Addendum)
 She has previously been intolerant of atorvastatin as well as simvastatin in the distant history but now has been on rosuvastatin  that was bumped up to 40 mg and she could not tolerate even that.  She actually is not even taking the full 10 mg tablets.  See plan for lipids. Adding Zetia, rechecking labs and referring to [facility].  Perhaps Leqvio would be good option for her.

## 2024-04-26 NOTE — Assessment & Plan Note (Addendum)
 S/p DES PCI of LAD:  Echocardiogram shows normal function.  Statin Intolerant Persistent Fatigue limits medical management. Discussed cholesterol management and antiplatelet therapy to prevent further stenosis and stent thrombosis. For now continue DAPT (81 mg aspirin , send 5 mg Plavix ) to complete 6 months treatment (February 2026) - Continue Plavix  indefinitely; but okay to interrupt as of February 2026 - Discontinue aspirin  after six months.  (February 2026) - Monitor for symptoms of bleeding or anemia. => Check CBC, TSH due to fatigue Beta-blocker stopped presumably because of fatigue.  Continue low-dose Micardis with well-controlled home blood pressures Statin intolerance-now on co-Q10 100 mg daily and 5 mg rosuvastatin  Continue even low-dose rosuvastatin  starting 5 mg every the day and increase to daily if possible Add Zetia 10 mg Check lipid panel Refer to [facility] lipid clinic for additional management pending labs.

## 2024-05-07 DIAGNOSIS — U071 COVID-19: Secondary | ICD-10-CM | POA: Diagnosis not present

## 2024-05-28 ENCOUNTER — Encounter: Payer: Self-pay | Admitting: Cardiology

## 2024-06-03 NOTE — Telephone Encounter (Signed)
12/5 is fine

## 2024-06-05 MED ORDER — HYDROCHLOROTHIAZIDE 12.5 MG PO CAPS: 12.5000 mg | ORAL_CAPSULE | Freq: Every day | ORAL | 3 refills | Status: AC

## 2024-06-06 ENCOUNTER — Encounter: Payer: Self-pay | Admitting: Cardiology

## 2024-06-06 DIAGNOSIS — E785 Hyperlipidemia, unspecified: Secondary | ICD-10-CM

## 2024-06-07 MED ORDER — CLOPIDOGREL BISULFATE 75 MG PO TABS: 75.0000 mg | ORAL_TABLET | Freq: Every day | ORAL | 3 refills | Status: AC

## 2024-06-13 ENCOUNTER — Other Ambulatory Visit: Payer: Self-pay | Admitting: Cardiology

## 2024-06-16 ENCOUNTER — Encounter: Payer: Self-pay | Admitting: Cardiology

## 2024-06-19 ENCOUNTER — Other Ambulatory Visit (HOSPITAL_COMMUNITY): Payer: Self-pay

## 2024-06-19 ENCOUNTER — Telehealth: Payer: Self-pay | Admitting: Pharmacy Technician

## 2024-06-19 ENCOUNTER — Telehealth: Payer: Self-pay

## 2024-06-19 ENCOUNTER — Ambulatory Visit

## 2024-06-19 DIAGNOSIS — E785 Hyperlipidemia, unspecified: Secondary | ICD-10-CM | POA: Diagnosis not present

## 2024-06-19 LAB — LIPID PANEL
Chol/HDL Ratio: 3.5 ratio (ref 0.0–4.4)
Cholesterol, Total: 138 mg/dL (ref 100–199)
HDL: 40 mg/dL
LDL Chol Calc (NIH): 77 mg/dL (ref 0–99)
Triglycerides: 116 mg/dL (ref 0–149)
VLDL Cholesterol Cal: 21 mg/dL (ref 5–40)

## 2024-06-19 NOTE — Telephone Encounter (Signed)
 Please see other encounter.

## 2024-06-19 NOTE — Assessment & Plan Note (Addendum)
 Assessment:  LDL goal: < 55 mg/dl; last LDLc  95 mg/dl (88/7974) while only taking rosuvastatin  5 mg every other day Liver enzymes wnl Ezetimibe  10 mg daily added ~ month ago; patient reports no symptoms related to this medication Discussed next potential options (PCSK-9 inhibitors, bempedoic acid and inclisiran); cost, dosing efficacy, side effects  Patient willing to proceed with PCSK9i therapy Emphasized importance of a heart-healthy diet and regular physical activity; encouraged patient to start walking in the yard and gradually increase activity  Plan: Continue taking rosuvastatin  5 mg every other day and ezetimibe  10 mg daily PCSK9i will replace statin once obtained  Will apply for PA for PCSK9i; will inform patient upon approval Lipid lab due in 3 months after starting PCSK9i

## 2024-06-19 NOTE — Telephone Encounter (Signed)
 Pharmacy Patient Advocate Encounter   Received notification from Physician's Office that prior authorization for Repatha is required/requested.   Insurance verification completed.   The patient is insured through [employer].   Per test claim: PA required; PA submitted to above mentioned insurance via Latent Key/confirmation #/EOC [id number] Status is pending

## 2024-06-19 NOTE — Progress Notes (Signed)
 Patient ID: Jaclyn Golden                 DOB: [birth year]                    MRN: [id number]      HPI: Jaclyn Golden is a 73 y.o. female patient referred to lipid clinic by Dr. Anner. PMH is significant for HLD, CAD s/p DES PCI to LAD (12/2023), HTN, and obesity.  Patient was last seen by Dr. Anner in November 2025 for CAD follow-up. Of note patient was hospitalized from July 25-29, 2025, due to symptoms concerning for ACS/unstable angina. MI was ruled out. Coronary CTA which revealed severe mid LAD stenosis. She underwent cardiac catheterization on 28 July which revealed 40% and 70% mid LAD lesions involving both D1 and D2 branches with both lesions treated with a 1 single stent.   She has a history of statin intolerance, previously unable to tolerate atorvastatin and simvastatin. Rosuvastatin  was increased to 40 mg but was not tolerated; she is currently taking rosuvastatin  5 mg every other day. Zetia  was added in November 2025 when a lipid panel was obtained.  Notably, the patient has a significant cardiac family history: Her son died of a heart attack at age 60, and four brothers also died of heart attacks, with one brother dying of a stroke.  Patient presents today in good spirits. She is currently taking rosuvastatin  5 mg every other day and ezetimibe  10 mg daily for HLD management. Still reports some myopathy from low dose of rosuvastatin  and interested in alternatives   We reviewed options for lowering LDL cholesterol, including PCSK-9 inhibitors, bempedoic acid and inclisiran.  Discussed mechanisms of action, dosing, side effects and potential decreases in LDL cholesterol.  Also reviewed cost information and potential options for patient assistance.   Current Medications: rosuvastatin  5 mg every other day, ezetimibe  10 mg daily  Intolerances: rosuvastatin , atorvastatin and simvastatin (myopathy), Risk Factors: age, CAD s/p DES PCI to LAD, HTN, famly hx of ASCVD  LDL goal: < 70,  ideally 55 Lipid panel (04/2024): Chol 163, Trig 141, HDL 43, LDL 95, reflective of rosuvastatin  5 mg every other day Lpa (12/2023): 17.8 Liver enzymes (04/2024): AST 25, ALT 19, Alk phos 112  Diet:  Breakfast typically includes oatmeal, Honey Nut Cheerios, or protein pancakes. Lunch and dinner often consist of a peanut butter sandwich, green beans, salads, and zucchini. Snacks are mainly fruits. For beverages, the patient drinks mostly water about 3- 32 ounces daily, and occasionally soda.  Exercise:  None; patient willing to start walking in her yard a couple times per week   Family History:  The patient's son died of a heart attack at 50 and four brothers died of heart attacks, with one dying of a stroke.   Social History:  Alcohol: none Smoking: none   Labs:  Lipid Panel     Component Value Date/Time   CHOL 163 04/23/2024 1143   TRIG 141 04/23/2024 1143   HDL 43 04/23/2024 1143   CHOLHDL 3.8 04/23/2024 1143   CHOLHDL 3.5 01/13/2024 0301   VLDL 19 01/13/2024 0301   LDLCALC 95 04/23/2024 1143   LABVLDL 25 04/23/2024 1143    Past Medical History:  Diagnosis Date   Arthritis    right ankle, wears brace prn   CAD S/P DES PCI mid LAD crossing D1 and D2 01/15/2024   40% and 70% mid LAD lesions involving D1-D2 => DES PCI with Onyx  frontier DES 2.5 mg x 26 mm tapered from 2.75 to 2.5 mm.   Hyperlipidemia    Hypertension    Pain in head    occasional pain back of head on right for last couple of years, to see neurlogist for     Medications Ordered Prior to Encounter[1]  Allergies[2]  Assessment/Plan:  1. Hyperlipidemia -  Problem  Hyperlipidemia With Target Low Density Lipoprotein (Ldl) Cholesterol Less Than 55 Mg/Dl   Hyperlipidemia with target low density lipoprotein (LDL) cholesterol less than 55 mg/dL Assessment:  LDL goal: < 55 mg/dl; last LDLc  95 mg/dl (88/7974) while only taking rosuvastatin  5 mg every other day Liver enzymes wnl Ezetimibe  10 mg daily added  ~ month ago; patient reports no symptoms related to this medication Discussed next potential options (PCSK-9 inhibitors, bempedoic acid and inclisiran); cost, dosing efficacy, side effects  Patient willing to proceed with PCSK9i therapy Emphasized importance of a heart-healthy diet and regular physical activity; encouraged patient to start walking in the yard and gradually increase activity  Plan: Continue taking rosuvastatin  5 mg every other day and ezetimibe  10 mg daily PCSK9i will replace statin once obtained  Will apply for PA for PCSK9i; will inform patient upon approval Lipid lab due in 3 months after starting PCSK9i    Thank you,  Jaclyn Golden E. Jaclyn Golden, Pharm.D, CPP Jaclyn Golden Jaclyn Golden. [facility] [address], [place], [place] [address] Phone: [phone]; Fax: [phone]        [1]  Current Outpatient Medications on File Prior to Visit  Medication Sig Dispense Refill   rosuvastatin  (CRESTOR ) 5 MG tablet Take 1 tablet (5 mg total) by mouth daily. (Patient taking differently: Take 5 mg by mouth every other day.) 90 tablet 3   aspirin  EC 81 MG tablet Take 1 tablet (81 mg total) by mouth daily. Swallow whole. 60 tablet 0   clopidogrel  (PLAVIX ) 75 MG tablet Take 1 tablet (75 mg total) by mouth daily with breakfast. 90 tablet 3   Coenzyme Q10 100 MG capsule Take 100 mg by mouth daily.     ezetimibe  (ZETIA ) 10 MG tablet Take 1 tablet (10 mg total) by mouth daily. 90 tablet 3   hydrochlorothiazide  (MICROZIDE ) 12.5 MG capsule Take 1 capsule (12.5 mg total) by mouth daily. 90 capsule 3   meclizine  (ANTIVERT ) 25 MG tablet Take 1 tablet (25 mg total) by mouth 3 (three) times daily as needed for dizziness. 30 tablet 0   Multiple Vitamins-Minerals (PRESERVISION AREDS 2 PO) Take 1 capsule by mouth in the morning and at bedtime.     telmisartan (MICARDIS) 20 MG tablet Take 20 mg by mouth daily.     No current facility-administered medications on file  prior to visit.  [2]  Allergies Allergen Reactions   Penicillins Other (See Comments)    Pass out   Has patient had a PCN reaction causing immediate rash, facial/tongue/throat swelling, SOB or lightheadedness with hypotension:YES Has patient had a PCN reaction causing severe rash involving mucus membranes or skin necrosis:No Has patient had a PCN reaction that required hospitalization:NO Has patient had a PCN reaction occurring within the last 10 years:NO If all of the above answers are NO, then may proceed with Cephalosporin use.

## 2024-06-19 NOTE — Telephone Encounter (Signed)
 Pharmacy Patient Advocate Encounter  Received notification from [employer] that Prior Authorization for Repatha has been APPROVED from 06/19/24 to 06/19/25. Ran test claim, Copay is $419.93- one month. This test claim was processed through [facility]- copay amounts may vary at other pharmacies due to pharmacy/plan contracts, or as the patient moves through the different stages of their insurance plan.   PA #/Case ID/Reference #: [id number]

## 2024-06-19 NOTE — Patient Instructions (Addendum)
 Your Results:             Your most recent labs Goal  Total Cholesterol 163 < 200  Triglycerides 141 < 150  HDL (happy/good cholesterol) 43 > 40  LDL (lousy/bad cholesterol 95 < 70, ideally < 55   Medication changes: Stop the statin once you obtain Repatha or Praluent. Continue ezetimibe  10 mg daily.  We will start the process to get Praluent or Repatha covered by your insurance.  Once the prior authorization is complete, we will call you to let you know and confirm pharmacy information.   Lab orders: We want to repeat labs 3 months after starting PCSK9i.  We will send you a lab order to remind you once we          get closer to that time.    Shulamis Wenberg E. Jernard Reiber, Pharm.D, CPP [facility]. [facility] [address], [place], [place] [address] Phone: [phone]; Fax: [phone]     Praluent is a cholesterol medication that improved your body's ability to get rid of bad cholesterol known as LDL. It can lower your LDL up to 60%. It is an injection that is given under the skin every 2 weeks. The most common side effects of Praluent include runny nose, symptoms of the common cold, rarely flu or flu-like symptoms, back/muscle pain in about 3-4% of the patients, and redness, pain, or bruising at the injection site.    Repatha is a cholesterol medication that improved your body's ability to get rid of bad cholesterol known as LDL. It can lower your LDL up to 60%! It is an injection that is given under the skin every 2 weeks. The medication often requires a prior authorization from your insurance company. We will take care of submitting all the necessary information to your insurance company to get it approved. The most common side effects of Repatha include runny nose, symptoms of the common cold, rarely flu or flu-like symptoms, back/muscle pain in about 3-4% of the patients, and redness, pain, or bruising at the injection site.    It is also  recommended that patients with high cholesterol adhere to a heart healthy diet, get regular exercise, avoid use of tobacco products, and maintain a healthy weight. Steps that you can take to help in these areas:  Limit consumption of trans fats, saturated fats, and cholesterol in your diet  Increase intake of lean meats such as chicken, turkey, and fish  Increase intake of foods rich in fiber such as fresh fruits, vegetables, beans and oatmeal Exercise as you are able; even 30 minutes of walking daily can aid in increasing heart health      Copay Assistance:  The [employer] foundation offers assistance to help pay for medication copays.  They will cover copays for all cholesterol lowering meds, including statins, fibrates, omega-3 oils, ezetimibe , Repatha, Praluent, Nexletol, Nexlizet.  The cards are usually good for $2,500 or 12 months, whichever comes first. Go to healthwellfoundation.org Click on Apply Now Answer questions as to whom is applying (patient or representative) Your disease fund will be hypercholesterolemia - Medicare access Select the cholesterol medication you need assistance with (Repatha, Praluent, Nexlizet...) They will ask question about qualifying diagnosis - you can mark yes; and do you have insurance coverage.   When they ask what type of assistance you are interested in - copay assistance When you submit, the approval is usually within minutes.  You will need  to print the card information from the site You will need to show this information to your pharmacy, they will bill your Medicare Part D plan first -then bill [employer] --for the copay.   You can also call them at [phone], although the hold times can be quite long.

## 2024-06-20 ENCOUNTER — Ambulatory Visit: Payer: Self-pay | Admitting: Cardiovascular Disease

## 2024-06-24 ENCOUNTER — Other Ambulatory Visit (HOSPITAL_COMMUNITY): Payer: Self-pay

## 2024-06-24 ENCOUNTER — Telehealth: Payer: Self-pay | Admitting: Pharmacy Technician

## 2024-06-24 NOTE — Telephone Encounter (Signed)
 Patient Advocate Encounter   The patient was approved for a [employer] grant that will help cover the cost of REPATHA  Total amount awarded, 2500.  Effective: 05/25/24 - 05/24/25   APW:[id number] ERW:[id number] Hmnle:[id number] PI:[id number] [employer] ID: [id number]   Pharmacy provided with approval and processing information. Patient informed via mychart     Info sent to the [employer]

## 2024-06-25 MED ORDER — REPATHA SURECLICK 140 MG/ML ~~LOC~~ SOAJ: 140.0000 mg | SUBCUTANEOUS | 2 refills | Status: DC

## 2024-06-25 NOTE — Telephone Encounter (Signed)
Repatha rx sent.

## 2024-06-25 NOTE — Addendum Note (Signed)
 Addended by: Littie Chiem E on: 06/25/2024 12:25 PM   Modules accepted: Orders

## 2024-07-01 NOTE — Telephone Encounter (Signed)
 Fyi.

## 2024-07-09 ENCOUNTER — Encounter: Payer: Self-pay | Admitting: Cardiology

## 2024-07-10 NOTE — Telephone Encounter (Signed)
 Called the patient to discuss her reaction to Repatha . She reported experiencing dizziness and fatigue after the injection and does not wish to continue therapy. We reviewed alternative cholesterol-lowering options, but she declined additional treatment at this time. I initially told her that the PCSK9 inhibitor would replace her statin. She was instructed to resume her statin every other day and to continue ezetimibe  now that the PCSK9 inhibitor has been discontinued. Lipid panel from a couple weeks ago showed improvement in LDL from 95 to 77, which is encouraging. She has a follow-up visit with Dr. Anner in March for further discussion.

## 2024-07-10 NOTE — Addendum Note (Signed)
 Addended by: Keyly Baldonado E on: 07/10/2024 11:52 AM   Modules accepted: Orders

## 2024-07-10 NOTE — Telephone Encounter (Signed)
 I have addressed this. Please see encounter on 06/24/24

## 2024-07-22 ENCOUNTER — Encounter: Payer: Self-pay | Admitting: Cardiology

## 2024-07-24 ENCOUNTER — Encounter: Payer: Self-pay | Admitting: Cardiology

## 2024-07-26 NOTE — Telephone Encounter (Signed)
 Hopefully by being on Plavix  alone, this will less of an issue.  I think we will Get used to Plavix  because of amount of stent placed.  We want to lease get it through her entire year.  However, if you have a significant amount of bruising you can hold it for couple days.   You should be off aspirin  by now.     [name], MD 07/25/24 ,6:01 pm

## 2024-08-27 ENCOUNTER — Ambulatory Visit: Admitting: Cardiology
